# Patient Record
Sex: Female | Born: 1990 | Race: Asian | Hispanic: No | Marital: Married | Smoking: Never smoker
Health system: Southern US, Community
[De-identification: ages and names within clinical notes are randomized; demographics above are authoritative.]

## PROBLEM LIST (undated history)

## (undated) DIAGNOSIS — Z789 Other specified health status: Secondary | ICD-10-CM

## (undated) DIAGNOSIS — D649 Anemia, unspecified: Secondary | ICD-10-CM

## (undated) HISTORY — DX: Anemia, unspecified: D64.9

## (undated) HISTORY — PX: NO PAST SURGERIES: SHX2092

---

## 2011-11-18 ENCOUNTER — Emergency Department (INDEPENDENT_AMBULATORY_CARE_PROVIDER_SITE_OTHER)
Admission: EM | Admit: 2011-11-18 | Discharge: 2011-11-18 | Disposition: A | Discharge status: Home / Self Care | Payer: Self-pay | Source: Home / Self Care | Attending: Emergency Medicine | Admitting: Emergency Medicine

## 2011-11-18 ENCOUNTER — Encounter (HOSPITAL_COMMUNITY): Payer: Self-pay

## 2011-11-18 DIAGNOSIS — Z331 Pregnant state, incidental: Secondary | ICD-10-CM

## 2011-11-18 DIAGNOSIS — Z3490 Encounter for supervision of normal pregnancy, unspecified, unspecified trimester: Secondary | ICD-10-CM

## 2011-11-18 LAB — POCT PREGNANCY, URINE: Preg Test, Ur: POSITIVE — AB

## 2011-11-18 MED ORDER — PRENATAL VIT W/ FE BISG-FA 25-1 MG PO TABS: 1.0000 | ORAL_TABLET | Freq: Every morning | ORAL | Status: DC

## 2011-11-18 NOTE — ED Provider Notes (Signed)
History     CSN: [id number]  Arrival date & time 11/18/11  1118   First MD Initiated Contact with Patient 11/18/11 1223      Chief Complaint  Patient presents with  . Possible Pregnancy    (Consider location/radiation/quality/duration/timing/severity/associated sxs/prior treatment) Patient is a 21 y.o. female presenting with vomiting. The history is provided by the patient. No language interpreter was used.  Emesis  This is a new problem.  Pt is nauseated.  Pt reports she thinks she may be pregnant.  History reviewed. No pertinent past medical history.  History reviewed. No pertinent past surgical history.  No family history on file.  History  Substance Use Topics  . Smoking status: Never Smoker   . Smokeless tobacco: Not on file  . Alcohol Use: No    OB History    Grav Para Term Preterm Abortions TAB SAB Ect Mult Living                  Review of Systems  Gastrointestinal: Positive for vomiting.  All other systems reviewed and are negative.    Allergies  Review of patient's allergies indicates no known allergies.  Home Medications  No current outpatient prescriptions on file.  BP 109/69  Pulse 78  Temp 98.4 F (36.9 C) (Oral)  Resp 18  SpO2 100%  LMP 08/27/2011  Physical Exam  Nursing note and vitals reviewed. Constitutional: She is oriented to person, place, and time. She appears well-developed and well-nourished.  HENT:  Head: Normocephalic and atraumatic.  Eyes: Conjunctivae and EOM are normal. Pupils are equal, round, and reactive to light.  Neck: Normal range of motion.  Cardiovascular: Normal rate and normal heart sounds.   Pulmonary/Chest: Effort normal and breath sounds normal.  Abdominal: Soft.  Musculoskeletal: Normal range of motion.  Neurological: She is alert and oriented to person, place, and time.  Skin: Skin is warm.  Psychiatric: She has a normal mood and affect.    ED Course  Procedures (including critical care  time)  Labs Reviewed  POCT PREGNANCY, URINE - Abnormal; Notable for the following:    Preg Test, Ur POSITIVE (*)     All other components within normal limits   No results found.   1. Pregnancy       MDM  Prenatal vit        Lonia Skinner Reedy, [place] 11/18/11 1322

## 2011-11-18 NOTE — ED Notes (Signed)
Pt request pregnancy test.  LMP was 08/27/11.  Positive home pregnancy test.  Denies abdominal pain.  States having n/v.  Has one child that is 88 months old.

## 2011-11-18 NOTE — ED Notes (Signed)
Vitals obtained by CMA Gerilyn Nestle

## 2011-11-18 NOTE — ED Provider Notes (Signed)
Medical screening examination/treatment/procedure(s) were performed by non-physician practitioner and as supervising physician I was immediately available for consultation/collaboration.  Raynald Blend, MD 11/18/11 [phone]

## 2012-01-28 ENCOUNTER — Ambulatory Visit (INDEPENDENT_AMBULATORY_CARE_PROVIDER_SITE_OTHER): Payer: Medicaid Other | Admitting: Obstetrics and Gynecology

## 2012-01-28 DIAGNOSIS — Z3689 Encounter for other specified antenatal screening: Secondary | ICD-10-CM

## 2012-01-28 DIAGNOSIS — Z331 Pregnant state, incidental: Secondary | ICD-10-CM

## 2012-01-28 LAB — POCT URINALYSIS DIPSTICK
Bilirubin, UA: NEGATIVE
Leukocytes, UA: NEGATIVE
Nitrite, UA: NEGATIVE
pH, UA: 8

## 2012-01-28 NOTE — Progress Notes (Signed)
NOB INTERVIEW.  Pt's husband served as Nurse, learning disability. States does want QUAD screen.  Is aware if not covered by insurance, is responsible.  Anatomy U/S scheduled per SL.

## 2012-01-29 ENCOUNTER — Encounter: Payer: Self-pay | Admitting: Obstetrics and Gynecology

## 2012-01-29 ENCOUNTER — Ambulatory Visit (INDEPENDENT_AMBULATORY_CARE_PROVIDER_SITE_OTHER): Payer: Medicaid Other | Admitting: Obstetrics and Gynecology

## 2012-01-29 ENCOUNTER — Ambulatory Visit (INDEPENDENT_AMBULATORY_CARE_PROVIDER_SITE_OTHER): Payer: Medicaid Other

## 2012-01-29 VITALS — BP 100/58 | Wt 121.0 lb

## 2012-01-29 DIAGNOSIS — Z331 Pregnant state, incidental: Secondary | ICD-10-CM

## 2012-01-29 DIAGNOSIS — Z124 Encounter for screening for malignant neoplasm of cervix: Secondary | ICD-10-CM

## 2012-01-29 DIAGNOSIS — Z3689 Encounter for other specified antenatal screening: Secondary | ICD-10-CM

## 2012-01-29 DIAGNOSIS — N898 Other specified noninflammatory disorders of vagina: Secondary | ICD-10-CM

## 2012-01-29 LAB — AFP, QUAD SCREEN
AFP: 145 IU/mL
Age Alone: 1:1180 {titer}
HCG, Total: 18207 m[IU]/mL
Interpretation-AFP: NEGATIVE
MoM for AFP: 2.02
MoM for hCG: 0.97
Open Spina bifida: NEGATIVE
Tri 18 Scr Risk Est: NEGATIVE
uE3 Mom: 0.64
uE3 Value: 1.5 ng/mL

## 2012-01-29 LAB — PRENATAL PANEL VII
Basophils Absolute: 0 10*3/uL (ref 0.0–0.1)
Basophils Relative: 0 % (ref 0–1)
HIV: NONREACTIVE
Hemoglobin: 10.9 g/dL — ABNORMAL LOW (ref 12.0–15.0)
Hepatitis B Surface Ag: NEGATIVE
Lymphocytes Relative: 14 % (ref 12–46)
MCHC: 33.9 g/dL (ref 30.0–36.0)
Monocytes Relative: 8 % (ref 3–12)
Neutro Abs: 6.9 10*3/uL (ref 1.7–7.7)
Neutrophils Relative %: 77 % (ref 43–77)
WBC: 9 10*3/uL (ref 4.0–10.5)

## 2012-01-29 LAB — POCT WET PREP (WET MOUNT)
Bacteria Wet Prep HPF POC: NEGATIVE
Clue Cells Wet Prep Whiff POC: NEGATIVE
Trichomonas Wet Prep HPF POC: NEGATIVE
WBC, Wet Prep HPF POC: NEGATIVE
pH: 4.5

## 2012-01-29 NOTE — Progress Notes (Signed)
[redacted]w[redacted]d  U/S: Breech presentation, posterior placenta (placenta edge is 4.8 CM From internal OS- Normal), fluid is normal (Vertical Pocket = 5.2 CM).  No Anomalies Seen  (Profile, Palate, Philtrum , Nasal Bone, Open Hands, 5TH Digit, Heel, Feet seen. Female gender.  Normal Ovaries, No fluid in CDS, Normal [place].   No record of pap, pt does not speak english. Spouse is present to translate.

## 2012-01-29 NOTE — Patient Instructions (Signed)
ABCs of Pregnancy  A  Antepartum care is very important. Be sure you see your doctor and get prenatal care as soon as you think you are pregnant. At this time, you will be tested for infection, genetic abnormalities and potential problems with you and the pregnancy. This is the time to discuss diet, exercise, work, medications, labor, pain medication during labor and the possibility of a cesarean delivery. Ask any questions that may concern you. It is important to see your doctor regularly throughout your pregnancy. Avoid exposure to toxic substances and chemicals - such as cleaning solvents, lead and mercury, some insecticides, and paint. Pregnant women should avoid exposure to paint fumes, and fumes that cause you to feel ill, dizzy or faint. When possible, it is a good idea to have a pre-pregnancy consultation with your caregiver to begin some important recommendations your caregiver suggests such as, taking folic acid, exercising, quitting smoking, avoiding alcoholic beverages, etc.  B  Breastfeeding is the healthiest choice for both you and your baby. It has many nutritional benefits for the baby and health benefits for the mother. It also creates a very tight and loving bond between the baby and mother. Talk to your doctor, your family and friends, and your employer about how you choose to feed your baby and how they can support you in your decision. Not all birth defects can be prevented, but a woman can take actions that may increase her chance of having a healthy baby. Many birth defects happen very early in pregnancy, sometimes before a woman even knows she is pregnant. Birth defects or abnormalities of any child in your or the father's family should be discussed with your caregiver. Get a good support bra as your breast size changes. Wear it especially when you exercise and when nursing.   C  Celebrate the news of your pregnancy with the your spouse/father and family. Childbirth classes are helpful to  take for you and the spouse/father because it helps to understand what happens during the pregnancy, labor and delivery. Cesarean delivery should be discussed with your doctor so you are prepared for that possibility. The pros and cons of circumcision if it is a boy, should be discussed with your pediatrician. Cigarette smoking during pregnancy can result in low birth weight babies. It has been associated with infertility, miscarriages, tubal pregnancies, infant death (mortality) and poor health (morbidity) in childhood. Additionally, cigarette smoking may cause long-term learning disabilities. If you smoke, you should try to quit before getting pregnant and not smoke during the pregnancy. Secondary smoke may also harm a mother and her developing baby. It is a good idea to ask people to stop smoking around you during your pregnancy and after the baby is born. Extra calcium is necessary when you are pregnant and is found in your prenatal vitamin, in dairy products, green leafy vegetables and in calcium supplements.  D  A healthy diet according to your current weight and height, along with vitamins and mineral supplements should be discussed with your caregiver. Domestic abuse or violence should be made known to your doctor right away to get the situation corrected. Drink more water when you exercise to keep hydrated. Discomfort of your back and legs usually develops and progresses from the middle of the second trimester through to delivery of the baby. This is because of the enlarging baby and uterus, which may also affect your balance. Do not take illegal drugs. Illegal drugs can seriously harm the baby and you. Drink extra   fluids (water is best) throughout pregnancy to help your body keep up with the increases in your blood volume. Drink at least 6 to 8 glasses of water, fruit juice, or milk each day. A good way to know you are drinking enough fluid is when your urine looks almost like clear water or is very light  yellow.   E  Eat healthy to get the nutrients you and your unborn baby need. Your meals should include the five basic food groups. Exercise (30 minutes of light to moderate exercise a day) is important and encouraged during pregnancy, if there are no medical problems or problems with the pregnancy. Exercise that causes discomfort or dizziness should be stopped and reported to your caregiver. Emotions during pregnancy can change from being ecstatic to depression and should be understood by you, your partner and your family.  F  Fetal screening with ultrasound, amniocentesis and monitoring during pregnancy and labor is common and sometimes necessary. Take 400 micrograms of folic acid daily both before, when possible, and during the first few months of pregnancy to reduce the risk of birth defects of the brain and spine. All women who could possibly become pregnant should take a vitamin with folic acid, every day. It is also important to eat a healthy diet with fortified foods (enriched grain products, including cereals, rice, breads, and pastas) and foods with natural sources of folate (orange juice, green leafy vegetables, beans, peanuts, broccoli, asparagus, peas, and lentils). The father should be involved with all aspects of the pregnancy including, the prenatal care, childbirth classes, labor, delivery, and postpartum time. Fathers may also have emotional concerns about being a father, financial needs, and raising a family.  G  Genetic testing should be done appropriately. It is important to know your family and the father's history. If there have been problems with pregnancies or birth defects in your family, report these to your doctor. Also, genetic counselors can talk with you about the information you might need in making decisions about having a family. You can call a major medical center in your area for help in finding a board-certified genetic counselor. Genetic testing and counseling should be done  before pregnancy when possible, especially if there is a history of problems in the mother's or father's family. Certain ethnic backgrounds are more at risk for genetic defects.  H  Get familiar with the hospital where you will be having your baby. Get to know how long it takes to get there, the labor and delivery area, and the hospital procedures. Be sure your medical insurance is accepted there. Get your home ready for the baby including, clothes, the baby's room (when possible), furniture and car seat. Hand washing is important throughout the day, especially after handling raw meat and poultry, changing the baby's diaper or using the bathroom. This can help prevent the spread of many bacteria and viruses that cause infection. Your hair may become dry and thinner, but will return to normal a few weeks after the baby is born. Heartburn is a common problem that can be treated by taking antacids recommended by your caregiver, eating smaller meals 5 or 6 times a day, not drinking liquids when eating, drinking between meals and raising the head of your bed 2 to 3 inches.  I  Insurance to cover you, the baby, doctor and hospital should be reviewed so that you will be prepared to pay any costs not covered by your insurance plan. If you do not have medical insurance,   there are usually clinics and services available for you in your community. Take 30 milligrams of iron during your pregnancy as prescribed by your doctor to reduce the risk of low red blood cells (anemia) later in pregnancy. All women of childbearing age should eat a diet rich in iron.  J  There should be a joint effort for the mother, father and any other children to adapt to the pregnancy financially, emotionally, and psychologically during the pregnancy. Join a support group for moms-to-be. Or, join a class on parenting or childbirth. Have the family participate when possible.  K  Know your limits. Let your caregiver know if you experience any of the  following:   · Pain of any kind.  · Strong cramps.  · You develop a lot of weight in a short period of time (5 pounds in 3 to 5 days).  · Vaginal bleeding, leaking of amniotic fluid.  · Headache, vision problems.  · Dizziness, fainting, shortness of breath.  · Chest pain.  · Fever of 102° F (38.9° C) or higher.  · Gush of clear fluid from your vagina.  · Painful urination.  · Domestic violence.  · Irregular heartbeat (palpitations).  · Rapid beating of the heart (tachycardia).  · Constant feeling sick to your stomach (nauseous) and vomiting.  · Trouble walking, fluid retention (edema).  · Muscle weakness.  · If your baby has decreased activity.  · Persistent diarrhea.  · Abnormal vaginal discharge.  · Uterine contractions at 20-minute intervals.  · Back pain that travels down your leg.  L  Learn and practice that what you eat and drink should be in moderation and healthy for you and your baby. Legal drugs such as alcohol and caffeine are important issues for pregnant women. There is no safe amount of alcohol a woman can drink while pregnant. Fetal alcohol syndrome, a disorder characterized by growth retardation, facial abnormalities, and central nervous system dysfunction, is caused by a woman's use of alcohol during pregnancy. Caffeine, found in tea, coffee, soft drinks and chocolate, should also be limited. Be sure to read labels when trying to cut down on caffeine during pregnancy. More than 200 foods, beverages, and over-the-counter medications contain caffeine and have a high salt content! There are coffees and teas that do not contain caffeine.  M  Medical conditions such as diabetes, epilepsy, and high blood pressure should be treated and kept under control before pregnancy when possible, but especially during pregnancy. Ask your caregiver about any medications that may need to be changed or adjusted during pregnancy. If you are currently taking any medications, ask your caregiver if it is safe to take them  while you are pregnant or before getting pregnant when possible. Also, be sure to discuss any herbs or vitamins you are taking. They are medicines, too! Discuss with your doctor all medications, prescribed and over-the-counter, that you are taking. During your prenatal visit, discuss the medications your doctor may give you during labor and delivery.  N  Never be afraid to ask your doctor or caregiver questions about your health, the progress of the pregnancy, family problems, stressful situations, and recommendation for a pediatrician, if you do not have one. It is better to take all precautions and discuss any questions or concerns you may have during your office visits. It is a good idea to write down your questions before you visit the doctor.  O  Over-the-counter cough and cold remedies may contain alcohol or other ingredients that should   be avoided during pregnancy. Ask your caregiver about prescription, herbs or over-the-counter medications that you are taking or may consider taking while pregnant.   P  Physical activity during pregnancy can benefit both you and your baby by lessening discomfort and fatigue, providing a sense of well-being, and increasing the likelihood of early recovery after delivery. Light to moderate exercise during pregnancy strengthens the belly (abdominal) and back muscles. This helps improve posture. Practicing yoga, walking, swimming, and cycling on a stationary bicycle are usually safe exercises for pregnant women. Avoid scuba diving, exercise at high altitudes (over 3000 feet), skiing, horseback riding, contact sports, etc. Always check with your doctor before beginning any kind of exercise, especially during pregnancy and especially if you did not exercise before getting pregnant.  Q  Queasiness, stomach upset and morning sickness are common during pregnancy. Eating a couple of crackers or dry toast before getting out of bed. Foods that you normally love may make you feel sick to  your stomach. You may need to substitute other nutritious foods. Eating 5 or 6 small meals a day instead of 3 large ones may make you feel better. Do not drink with your meals, drink between meals. Questions that you have should be written down and asked during your prenatal visits.  R  Read about and make plans to baby-proof your home. There are important tips for making your home a safer environment for your baby. Review the tips and make your home safer for you and your baby. Read food labels regarding calories, salt and fat content in the food.  S  Saunas, hot tubs, and steam rooms should be avoided while you are pregnant. Excessive high heat may be harmful during your pregnancy. Your caregiver will screen and examine you for sexually transmitted diseases and genetic disorders during your prenatal visits. Learn the signs of labor. Sexual relations while pregnant is safe unless there is a medical or pregnancy problem and your caregiver advises against it.  T  Traveling long distances should be avoided especially in the third trimester of your pregnancy. If you do have to travel out of state, be sure to take a copy of your medical records and medical insurance plan with you. You should not travel long distances without seeing your doctor first. Most airlines will not allow you to travel after 36 weeks of pregnancy. Toxoplasmosis is an infection caused by a parasite that can seriously harm an unborn baby. Avoid eating undercooked meat and handling cat litter. Be sure to wear gloves when gardening. Tingling of the hands and fingers is not unusual and is due to fluid retention. This will go away after the baby is born.  U  Womb (uterus) size increases during the first trimester. Your kidneys will begin to function more efficiently. This may cause you to feel the need to urinate more often. You may also leak urine when sneezing, coughing or laughing. This is due to the growing uterus pressing against your bladder,  which lies directly in front of and slightly under the uterus during the first few months of pregnancy. If you experience burning along with frequency of urination or bloody urine, be sure to tell your doctor. The size of your uterus in the third trimester may cause a problem with your balance. It is advisable to maintain good posture and avoid wearing high heels during this time. An ultrasound of your baby may be necessary during your pregnancy and is safe for you and your baby.  V    Vaccinations are an important concern for pregnant women. Get needed vaccines before pregnancy. Center for Disease Control (www.cdc.gov) has clear guidelines for the use of vaccines during pregnancy. Review the list, be sure to discuss it with your doctor. Prenatal vitamins are helpful and healthy for you and the baby. Do not take extra vitamins except what is recommended. Taking too much of certain vitamins can cause overdose problems. Continuous vomiting should be reported to your caregiver. Varicose veins may appear especially if there is a family history of varicose veins. They should subside after the delivery of the baby. Support hose helps if there is leg discomfort.  W  Being overweight or underweight during pregnancy may cause problems. Try to get within 15 pounds of your ideal weight before pregnancy. Remember, pregnancy is not a time to be dieting! Do not stop eating or start skipping meals as your weight increases. Both you and your baby need the calories and nutrition you receive from a healthy diet. Be sure to consult with your doctor about your diet. There is a formula and diet plan available depending on whether you are overweight or underweight. Your caregiver or nutritionist can help and advise you if necessary.  X  Avoid X-rays. If you must have dental work or diagnostic tests, tell your dentist or physician that you are pregnant so that extra care can be taken. X-rays should only be taken when the risks of not taking  them outweigh the risk of taking them. If needed, only the minimum amount of radiation should be used. When X-rays are necessary, protective lead shields should be used to cover areas of the body that are not being X-rayed.  Y  Your baby loves you. Breastfeeding your baby creates a loving and very close bond between the two of you. Give your baby a healthy environment to live in while you are pregnant. Infants and children require constant care and guidance. Their health and safety should be carefully watched at all times. After the baby is born, rest or take a nap when the baby is sleeping.  Z  Get your ZZZs. Be sure to get plenty of rest. Resting on your side as often as possible, especially on your left side is advised. It provides the best circulation to your baby and helps reduce swelling. Try taking a nap for 30 to 45 minutes in the afternoon when possible. After the baby is born rest or take a nap when the baby is sleeping. Try elevating your feet for that amount of time when possible. It helps the circulation in your legs and helps reduce swelling.   Most information courtesy of the CDC.  Document Released: 03/11/2005 Document Revised: 06/03/2011 Document Reviewed: 11/23/2008  [employer]® Patient Information ©2013 [employer].

## 2012-01-29 NOTE — Progress Notes (Addendum)
[facility] NEW OB EXAMINATION   Courtney Clark is a 21 y.o. female, G2P1001, who presents at [redacted]w[redacted]d gestation for a new obstetrical examination.  The following portions of the patient's history were reviewed and updated as appropriate: allergies, current medications, past family history, past medical history, past social history, past surgical history and problem list.  OB History    Grav Para Term Preterm Abortions TAB SAB Ect Mult Living   2 1 1       1       Past Medical History  Diagnosis Date  . Anemia     PP    Past Surgical History  Procedure Date  . No past surgeries     Family History  Problem Relation Age of Onset  . Heart disease Mother     Social History:  reports that she has never smoked. She has never used smokeless tobacco. She reports that she does not drink alcohol or use illicit drugs.  Allergies: No Known Allergies  Medications: none yet but we will start prenatal vitamins   Objective:    BP 100/58  Wt 121 lb (54.885 kg)  LMP 08/27/2011    Weight:  Wt Readings from Last 1 Encounters:  01/29/12 121 lb (54.885 kg) (35.75%*)   * Growth percentiles are based on CDC 2-20 Years data.          BMI: There is no height on file to calculate BMI.  General Appearance: Alert, appropriate appearance for age. No acute distress HEENT: Grossly normal Neck / Thyroid: Supple, no masses, nodes or enlargement Lungs: clear to auscultation bilaterally Back: No CVA tenderness Breast Exam: No masses or nodes.No dimpling, nipple retraction or discharge. Cardiovascular: Regular rate and rhythm. S1, S2, no murmur Gastrointestinal: Soft, non-tender, no masses or organomegaly.                               Fundal height: 22 weeks                               Fetal heart tones audible: yes  ++++++++++++++++++++++++++++++++++++++++++++++++++++++++  Pelvic Exam: External genitalia: normal general appearance Vaginal: normal without tenderness, induration or masses and  relaxation: No Cervix: normal appearance Adnexa: normal bimanual exam Uterus: gravid, nontender, 22 weeks size  ++++++++++++++++++++++++++++++++++++++++++++++++++++++++  Lymphatic Exam: Non-palpable nodes in neck, clavicular, axillary, or inguinal regions Neurologic: Normal speech, no tremor  Psychiatric: Alert and oriented, appropriate affect.  Prenatal labs: ABO, Rh: O/POS/-- (11/05 1202) Antibody: NEG (11/05 1202) Rubella:  immune RPR: NON REAC (11/05 1202)  HBsAg: NEGATIVE (11/05 1202)  HIV: NON REACTIVE (11/05 1202)  GBS:   third trimester  Wet Prep:   Previously done:            no                     If no: Whiff:                     Negative                              Clue cells:             no  PH:                        4.5                              Yeast:                    no                              Trichomoniasis:    no  Urine analysis:     Negative    Assessment:   21 y.o. female G2P1001 at [redacted]w[redacted]d gestation ( EDC is June 02, 2012) by: Normal Last menstrual period: yes Ultrasound:  Single gestation, breech presentation, normal fluid, normal anatomy, female, cervix 3.79 cm, 15 ounces (16%) but estimated gestational age is 22 weeks and 0 days based on today's ultrasound.    Late prenatal care.                             Patient has limited English skills   Plan:    GC and Chlamydia sent.  Glucola next visit.  Repeat ultrasound if size is less than date.  Urine culture sent.  We discussed routine pregnancy issues:  The patient was told to avoid predator fish including tuna because of our concerns for mercury consumption.  The patient was told to avoid soft cheeses.  The patient was told to be sure that all lunch meats are well cooked.  Genetic screening was discussed. no  Our model for pregnancy management was reviewed.  Proper diet and exercise reviewed.  Return to office in 4 weeks.  Medications  include:  Prenatal vitamins  ABC's of pregnancy given.  Mylinda Latina.D.

## 2012-01-30 LAB — PAP IG, CT-NG, RFX HPV ASCU

## 2012-01-30 LAB — CULTURE, OB URINE: Colony Count: 30000

## 2012-01-30 LAB — US OB COMP + 14 WK

## 2012-01-30 LAB — VARICELLA ZOSTER ANTIBODY, IGM: Varicella Zoster Ab IgM: 0.22 {ISR} (ref <0.91)

## 2012-01-31 LAB — HEMOGLOBINOPATHY EVALUATION
Hemoglobin Other: 17.3 % — ABNORMAL HIGH
Hgb A2 Quant: 4.1 % — ABNORMAL HIGH (ref 2.2–3.2)
Hgb F Quant: 0 % (ref 0.0–2.0)

## 2012-02-01 ENCOUNTER — Encounter: Payer: Self-pay | Admitting: Obstetrics and Gynecology

## 2012-02-01 DIAGNOSIS — D582 Other hemoglobinopathies: Secondary | ICD-10-CM | POA: Insufficient documentation

## 2012-02-01 DIAGNOSIS — D649 Anemia, unspecified: Secondary | ICD-10-CM | POA: Insufficient documentation

## 2012-02-06 ENCOUNTER — Encounter: Payer: Self-pay | Admitting: Obstetrics and Gynecology

## 2012-02-06 DIAGNOSIS — O0930 Supervision of pregnancy with insufficient antenatal care, unspecified trimester: Secondary | ICD-10-CM | POA: Insufficient documentation

## 2012-02-06 LAB — HGB ELECTROPHORESIS REFLEXED REPORT
Hemoglobin A2 - HGBRFX: 4.3 % — ABNORMAL HIGH (ref 1.8–3.5)
Hemoglobin E: 18.1 % — ABNORMAL HIGH
Hemoglobin F - HGBRFX: 0.2 % (ref <2.0)

## 2012-02-26 ENCOUNTER — Ambulatory Visit (INDEPENDENT_AMBULATORY_CARE_PROVIDER_SITE_OTHER): Payer: Medicaid Other | Admitting: Obstetrics and Gynecology

## 2012-02-26 ENCOUNTER — Encounter: Payer: Self-pay | Admitting: Obstetrics and Gynecology

## 2012-02-26 VITALS — BP 110/60 | Wt 127.0 lb

## 2012-02-26 DIAGNOSIS — Z331 Pregnant state, incidental: Secondary | ICD-10-CM

## 2012-02-26 NOTE — Progress Notes (Signed)
[redacted]w[redacted]d Pt with some itching on her abdomen No rash seen.  recommend benadryl cream or lubriderm

## 2012-02-26 NOTE — Patient Instructions (Signed)
Try benadryl cream or lubriderm for itching

## 2012-02-26 NOTE — Progress Notes (Signed)
Pt declines flu shot.  

## 2012-03-12 ENCOUNTER — Ambulatory Visit (INDEPENDENT_AMBULATORY_CARE_PROVIDER_SITE_OTHER): Payer: Medicaid Other | Admitting: Obstetrics and Gynecology

## 2012-03-12 VITALS — BP 110/70 | Ht 62.0 in | Wt 130.0 lb

## 2012-03-12 DIAGNOSIS — O0930 Supervision of pregnancy with insufficient antenatal care, unspecified trimester: Secondary | ICD-10-CM

## 2012-03-12 DIAGNOSIS — Z331 Pregnant state, incidental: Secondary | ICD-10-CM

## 2012-03-12 DIAGNOSIS — D649 Anemia, unspecified: Secondary | ICD-10-CM

## 2012-03-12 LAB — HEMOGLOBIN: Hemoglobin: 9 g/dL — ABNORMAL LOW (ref 12.0–15.0)

## 2012-03-12 NOTE — Progress Notes (Signed)
[redacted]w[redacted]d 1hr gtt today No complaints today. Declines flu shot

## 2012-03-17 ENCOUNTER — Other Ambulatory Visit: Payer: Self-pay | Admitting: Obstetrics and Gynecology

## 2012-03-17 ENCOUNTER — Telehealth: Payer: Self-pay | Admitting: Obstetrics and Gynecology

## 2012-03-17 DIAGNOSIS — O99810 Abnormal glucose complicating pregnancy: Secondary | ICD-10-CM

## 2012-03-17 NOTE — Telephone Encounter (Signed)
Notified pt of elevated 1 hr GTT.  Sch pt for 3 hr GTT on 03-31-2012.  Mailed pt diet and appt info.

## 2012-03-24 ENCOUNTER — Ambulatory Visit (INDEPENDENT_AMBULATORY_CARE_PROVIDER_SITE_OTHER): Payer: Medicaid Other | Admitting: Obstetrics and Gynecology

## 2012-03-24 VITALS — BP 98/58 | Wt 131.0 lb

## 2012-03-24 DIAGNOSIS — O0930 Supervision of pregnancy with insufficient antenatal care, unspecified trimester: Secondary | ICD-10-CM

## 2012-03-24 NOTE — Progress Notes (Signed)
[redacted]w[redacted]d  Pt voices no complaints Has 3 hr gtt scheduled 03/31/12 Acknowledges understanding of preparatory diet

## 2012-03-25 NOTE — L&D Delivery Note (Signed)
Delivery Note Pt's labor progressed well after admission.  Pt received one dose of IV Fentanyl at 0751 and cx 6-7 cm at that time.  At 0905 cx 9cm and AROM large amt of MSF.  Cervix complete at 0918 and pushing started.  At 9:33 AM a viable female "[name]" was delivered via Vaginal, Spontaneous Delivery (Presentation: Right Occiput Anterior).  APGAR: , TBA; weight 6 lb 15 oz (3147 g).   Placenta status: Intact, Spontaneous, MS, Schultz w/ trailing membranes.  Cord: 3 vessels with the following complications: None.  Cord pH: not collected.  Anesthesia: Other  Episiotomy: None Lacerations: 1st degree;Sulcus Suture Repair: 3 interrupted stitches; 3-0 & 4-0 monocryl Est. Blood Loss (mL):  Mom to postpartum.  Baby to nursery-stable. Declines circumcision and currently intends to formula-feed. Pt, FOB, pt's sister, and newborn all bonding well in recovery and stable.  Courtney Clark H 05/21/2012, 10:11 AM

## 2012-04-01 LAB — GLUCOSE TOLERANCE, 3 HOURS
Glucose Tolerance, 2 hour: 157 mg/dL (ref 70–164)
Glucose Tolerance, Fasting: 80 mg/dL (ref 70–104)
Glucose, GTT - 3 Hour: 141 mg/dL (ref 70–144)

## 2012-04-06 ENCOUNTER — Encounter: Payer: Medicaid Other | Admitting: Obstetrics and Gynecology

## 2012-04-09 ENCOUNTER — Ambulatory Visit: Payer: Medicaid Other | Admitting: Obstetrics and Gynecology

## 2012-04-09 ENCOUNTER — Encounter: Payer: Self-pay | Admitting: Obstetrics and Gynecology

## 2012-04-09 VITALS — BP 106/58 | Wt 129.0 lb

## 2012-04-09 DIAGNOSIS — Z331 Pregnant state, incidental: Secondary | ICD-10-CM

## 2012-04-09 MED ORDER — FERROUS SULFATE 300 (60 FE) MG/5ML PO SYRP: 300.0000 mg | ORAL_SOLUTION | Freq: Every day | ORAL | Status: DC

## 2012-04-09 MED ORDER — DOCUSATE SODIUM 250 MG PO CAPS: 250.0000 mg | ORAL_CAPSULE | Freq: Every day | ORAL | Status: DC

## 2012-04-09 NOTE — Progress Notes (Signed)
[redacted]w[redacted]d  GFM 3 hr GTT normal. Hgb 9.0: nu-iron prescribed

## 2012-04-09 NOTE — Progress Notes (Signed)
[redacted]w[redacted]d Pt has no complaints

## 2012-04-23 ENCOUNTER — Ambulatory Visit: Payer: Medicaid Other | Admitting: Obstetrics and Gynecology

## 2012-04-23 VITALS — BP 100/62 | Wt 135.0 lb

## 2012-04-23 DIAGNOSIS — D649 Anemia, unspecified: Secondary | ICD-10-CM

## 2012-04-23 DIAGNOSIS — Z331 Pregnant state, incidental: Secondary | ICD-10-CM

## 2012-04-23 DIAGNOSIS — O0930 Supervision of pregnancy with insufficient antenatal care, unspecified trimester: Secondary | ICD-10-CM

## 2012-04-23 MED ORDER — FERROUS SULFATE 300 (60 FE) MG/5ML PO SYRP: 300.0000 mg | ORAL_SOLUTION | Freq: Every day | ORAL | Status: DC

## 2012-04-23 NOTE — Progress Notes (Signed)
Pt stated no issues today. Pt did not get her iron due to pharmacy didn't have it . Will reorder rx again.  GC/CHL/GBS NV

## 2012-05-07 ENCOUNTER — Encounter: Payer: Medicaid Other | Admitting: Obstetrics and Gynecology

## 2012-05-07 ENCOUNTER — Other Ambulatory Visit: Payer: Medicaid Other

## 2012-05-13 ENCOUNTER — Other Ambulatory Visit: Payer: Medicaid Other

## 2012-05-13 ENCOUNTER — Encounter: Payer: Self-pay | Admitting: Family Medicine

## 2012-05-13 ENCOUNTER — Ambulatory Visit: Payer: Medicaid Other | Admitting: Family Medicine

## 2012-05-13 VITALS — BP 102/64 | Wt 139.0 lb

## 2012-05-13 NOTE — Progress Notes (Signed)
[redacted]w[redacted]d No complaints today.

## 2012-05-13 NOTE — Progress Notes (Signed)
[redacted]w[redacted]d Doing well. Would like cervical exam, has contractions off and on throughout the day, sometimes really painful. Education given on how to time contractions. Unsure of pediatrician. (List given) Having difficulty obtaining car seat. Encouraged to check with [facility] or DSS.  Patient considering Depo-Provera vs. Mirena for contraception (methods handout given).  Discussed circumcision and patient declined (handout given).    GBS collected.  1 week ROB need GC/CT. L.Jannice Beitzel, FNP-BC

## 2012-05-15 LAB — STREP B DNA PROBE: GBSP: NEGATIVE

## 2012-05-21 ENCOUNTER — Encounter (HOSPITAL_COMMUNITY): Payer: Self-pay | Admitting: *Deleted

## 2012-05-21 ENCOUNTER — Inpatient Hospital Stay (HOSPITAL_COMMUNITY)
Admission: AD | Admit: 2012-05-21 | Discharge: 2012-05-22 | DRG: 775 | Disposition: A | Discharge status: Home / Self Care | Payer: Medicaid Other | Source: Ambulatory Visit | Attending: Obstetrics and Gynecology | Admitting: Obstetrics and Gynecology

## 2012-05-21 ENCOUNTER — Encounter: Payer: Medicaid Other | Admitting: Obstetrics and Gynecology

## 2012-05-21 DIAGNOSIS — O9902 Anemia complicating childbirth: Secondary | ICD-10-CM | POA: Diagnosis present

## 2012-05-21 DIAGNOSIS — Z603 Acculturation difficulty: Secondary | ICD-10-CM

## 2012-05-21 DIAGNOSIS — Z789 Other specified health status: Secondary | ICD-10-CM

## 2012-05-21 DIAGNOSIS — D649 Anemia, unspecified: Secondary | ICD-10-CM | POA: Diagnosis present

## 2012-05-21 HISTORY — DX: Other specified health status: Z78.9

## 2012-05-21 LAB — RPR: RPR Ser Ql: NONREACTIVE

## 2012-05-21 LAB — CBC
HCT: 32.5 % — ABNORMAL LOW (ref 36.0–46.0)
Hemoglobin: 10.1 g/dL — ABNORMAL LOW (ref 12.0–15.0)
MCH: 18.6 pg — ABNORMAL LOW (ref 26.0–34.0)
MCHC: 31.1 g/dL (ref 30.0–36.0)
RBC: 5.44 MIL/uL — ABNORMAL HIGH (ref 3.87–5.11)

## 2012-05-21 LAB — ABO/RH: ABO/RH(D): O POS

## 2012-05-21 LAB — TYPE AND SCREEN
ABO/RH(D): O POS
Antibody Screen: NEGATIVE

## 2012-05-21 MED ORDER — MEASLES, MUMPS & RUBELLA VAC ~~LOC~~ INJ
0.5000 mL | INJECTION | Freq: Once | SUBCUTANEOUS | Status: DC
  Filled 2012-05-21: qty 0.5

## 2012-05-21 MED ORDER — BENZOCAINE-MENTHOL 20-0.5 % EX AERO: 1.0000 "application " | INHALATION_SPRAY | CUTANEOUS | Status: DC | PRN

## 2012-05-21 MED ORDER — MAGNESIUM HYDROXIDE 400 MG/5ML PO SUSP: 30.0000 mL | ORAL | Status: DC | PRN

## 2012-05-21 MED ORDER — CITRIC ACID-SODIUM CITRATE 334-500 MG/5ML PO SOLN: 30.0000 mL | ORAL | Status: DC | PRN

## 2012-05-21 MED ORDER — LANOLIN HYDROUS EX OINT: TOPICAL_OINTMENT | CUTANEOUS | Status: DC | PRN

## 2012-05-21 MED ORDER — OXYCODONE-ACETAMINOPHEN 5-325 MG PO TABS
1.0000 | ORAL_TABLET | ORAL | Status: DC | PRN
  Administered 2012-05-21: 1 via ORAL
  Filled 2012-05-21: qty 1

## 2012-05-21 MED ORDER — ONDANSETRON HCL 4 MG PO TABS: 4.0000 mg | ORAL_TABLET | ORAL | Status: DC | PRN

## 2012-05-21 MED ORDER — INFLUENZA VIRUS VACC SPLIT PF IM SUSP
0.5000 mL | INTRAMUSCULAR | Status: AC
  Administered 2012-05-22: 0.5 mL via INTRAMUSCULAR
  Filled 2012-05-21: qty 0.5

## 2012-05-21 MED ORDER — WITCH HAZEL-GLYCERIN EX PADS: 1.0000 "application " | MEDICATED_PAD | CUTANEOUS | Status: DC | PRN

## 2012-05-21 MED ORDER — OXYTOCIN 40 UNITS IN LACTATED RINGERS INFUSION - SIMPLE MED
62.5000 mL/h | INTRAVENOUS | Status: DC
  Administered 2012-05-21: 500 mL/h via INTRAVENOUS
  Filled 2012-05-21: qty 1000

## 2012-05-21 MED ORDER — IBUPROFEN 600 MG PO TABS
600.0000 mg | ORAL_TABLET | Freq: Four times a day (QID) | ORAL | Status: DC | PRN
  Administered 2012-05-21: 600 mg via ORAL
  Filled 2012-05-21: qty 1

## 2012-05-21 MED ORDER — SIMETHICONE 80 MG PO CHEW: 80.0000 mg | CHEWABLE_TABLET | ORAL | Status: DC | PRN

## 2012-05-21 MED ORDER — PRENATAL MULTIVITAMIN CH
1.0000 | ORAL_TABLET | Freq: Every day | ORAL | Status: DC
  Administered 2012-05-21 – 2012-05-22 (×2): 1 via ORAL
  Filled 2012-05-21 (×2): qty 1

## 2012-05-21 MED ORDER — OXYTOCIN BOLUS FROM INFUSION: 500.0000 mL | INTRAVENOUS | Status: DC

## 2012-05-21 MED ORDER — LIDOCAINE HCL (PF) 1 % IJ SOLN
30.0000 mL | INTRAMUSCULAR | Status: DC | PRN
  Administered 2012-05-21: 30 mL via SUBCUTANEOUS
  Filled 2012-05-21 (×2): qty 30

## 2012-05-21 MED ORDER — ONDANSETRON HCL 4 MG/2ML IJ SOLN: 4.0000 mg | INTRAMUSCULAR | Status: DC | PRN

## 2012-05-21 MED ORDER — OXYCODONE-ACETAMINOPHEN 5-325 MG PO TABS
1.0000 | ORAL_TABLET | ORAL | Status: DC | PRN
  Administered 2012-05-21 (×2): 1 via ORAL
  Filled 2012-05-21 (×2): qty 1

## 2012-05-21 MED ORDER — POLYSACCHARIDE IRON COMPLEX 150 MG PO CAPS
150.0000 mg | ORAL_CAPSULE | Freq: Two times a day (BID) | ORAL | Status: DC
  Administered 2012-05-21 – 2012-05-22 (×3): 150 mg via ORAL
  Filled 2012-05-21 (×5): qty 1

## 2012-05-21 MED ORDER — LACTATED RINGERS IV SOLN
INTRAVENOUS | Status: DC
  Administered 2012-05-21: 125 mL/h via INTRAVENOUS

## 2012-05-21 MED ORDER — ZOLPIDEM TARTRATE 5 MG PO TABS: 5.0000 mg | ORAL_TABLET | Freq: Every evening | ORAL | Status: DC | PRN

## 2012-05-21 MED ORDER — ACETAMINOPHEN 325 MG PO TABS: 650.0000 mg | ORAL_TABLET | ORAL | Status: DC | PRN

## 2012-05-21 MED ORDER — METHYLERGONOVINE MALEATE 0.2 MG PO TABS: 0.2000 mg | ORAL_TABLET | ORAL | Status: DC | PRN

## 2012-05-21 MED ORDER — LACTATED RINGERS IV SOLN: 500.0000 mL | INTRAVENOUS | Status: DC | PRN

## 2012-05-21 MED ORDER — MEDROXYPROGESTERONE ACETATE 150 MG/ML IM SUSP: 150.0000 mg | INTRAMUSCULAR | Status: DC | PRN

## 2012-05-21 MED ORDER — DIBUCAINE 1 % RE OINT: 1.0000 "application " | TOPICAL_OINTMENT | RECTAL | Status: DC | PRN

## 2012-05-21 MED ORDER — METHYLERGONOVINE MALEATE 0.2 MG/ML IJ SOLN: 0.2000 mg | INTRAMUSCULAR | Status: DC | PRN

## 2012-05-21 MED ORDER — ONDANSETRON HCL 4 MG/2ML IJ SOLN: 4.0000 mg | Freq: Four times a day (QID) | INTRAMUSCULAR | Status: DC | PRN

## 2012-05-21 MED ORDER — DIPHENHYDRAMINE HCL 25 MG PO CAPS: 25.0000 mg | ORAL_CAPSULE | Freq: Four times a day (QID) | ORAL | Status: DC | PRN

## 2012-05-21 MED ORDER — FENTANYL CITRATE 0.05 MG/ML IJ SOLN
100.0000 ug | INTRAMUSCULAR | Status: DC | PRN
  Administered 2012-05-21: 100 ug via INTRAVENOUS
  Filled 2012-05-21: qty 2

## 2012-05-21 MED ORDER — TETANUS-DIPHTH-ACELL PERTUSSIS 5-2.5-18.5 LF-MCG/0.5 IM SUSP
0.5000 mL | Freq: Once | INTRAMUSCULAR | Status: AC
  Administered 2012-05-21: 0.5 mL via INTRAMUSCULAR
  Filled 2012-05-21: qty 0.5

## 2012-05-21 MED ORDER — IBUPROFEN 600 MG PO TABS
600.0000 mg | ORAL_TABLET | Freq: Four times a day (QID) | ORAL | Status: DC
  Administered 2012-05-21 – 2012-05-22 (×4): 600 mg via ORAL
  Filled 2012-05-21 (×4): qty 1

## 2012-05-21 MED ORDER — SENNOSIDES-DOCUSATE SODIUM 8.6-50 MG PO TABS
2.0000 | ORAL_TABLET | Freq: Every day | ORAL | Status: DC
  Administered 2012-05-21: 2 via ORAL

## 2012-05-21 NOTE — MAU Note (Signed)
Abdominal pain for 2-3 minutes and then it will go away and then come back. Had some yellow liquid coming out yesterday.

## 2012-05-22 LAB — CBC
HCT: 29.1 % — ABNORMAL LOW (ref 36.0–46.0)
Hemoglobin: 9.2 g/dL — ABNORMAL LOW (ref 12.0–15.0)
MCV: 59.4 fL — ABNORMAL LOW (ref 78.0–100.0)
RBC: 4.9 MIL/uL (ref 3.87–5.11)
RDW: 21.4 % — ABNORMAL HIGH (ref 11.5–15.5)
WBC: 15.8 10*3/uL — ABNORMAL HIGH (ref 4.0–10.5)

## 2012-05-22 MED ORDER — IBUPROFEN 600 MG PO TABS: 600.0000 mg | ORAL_TABLET | Freq: Four times a day (QID) | ORAL | Status: DC

## 2012-05-22 MED ORDER — IRON POLYSACCH CMPLX-B12-FA 150-0.025-1 MG PO CAPS: 1.0000 | ORAL_CAPSULE | Freq: Every day | ORAL | Status: DC

## 2012-05-22 MED ORDER — MEDROXYPROGESTERONE ACETATE 150 MG/ML IM SUSP: 150.0000 mg | Freq: Once | INTRAMUSCULAR | Status: DC

## 2012-05-22 NOTE — H&P (Signed)
Courtney Clark is a 22 y.o.[place] female presenting in active labor at [redacted]w[redacted]d.  Accompanied by her s.o. "Courtney Clark" who speaks some English, and he states she was able to sleep some overnight and awoke w/ ctxs early this morning, 2-3 min apart.  No VB, but reports questionable yellow d/c/leakage yesterday.  GFM.  No UTI or PIH s/s. Pt was initially 3/50% on arrival to MAU, and after recheck, cx=4/100%.  Prenatal course: Pt entered care late at [facility] at 22 weeks.  Had normal anatomy u/s at [redacted]w[redacted]d w/ S=D, and negative Quad screen.  Her pregnancy was overall uncomplicated.  She did have anemia and dx'd w/ Hgb E; Rx'd iron but did not start until late 3rd trimester b/c of pharmacy issues.  She had an elevated 1hr gtt, but normal 3hr gtt. She plans to formula feed, and declines circumcision.  OB Hx: G1=SVD '11 in [place]; Female "Courtney Clark" weighing between 6-7 lbs; no complications G2=current  Maternal Medical History:  Reason for admission: Contractions.   Contractions: Onset was 3-5 hours ago.   Frequency: regular.   Perceived severity is strong.    Fetal activity: Perceived fetal activity is normal.      OB History   Grav Para Term Preterm Abortions TAB SAB Ect Mult Living   2 2 2  0 0 0 0 0 0 2     Past Medical History  Diagnosis Date  . Anemia     PP  . Language barrier 05/21/2012  . NSVD (normal spontaneous vaginal delivery) 05/21/2012   Past Surgical History  Procedure Laterality Date  . No past surgeries     Family History: family history includes Heart disease in her mother. Social History:  reports that she has never smoked. She has never used smokeless tobacco. She reports that she does not drink alcohol or use illicit drugs.   Prenatal Transfer Tool  Maternal Diabetes: No Genetic Screening: Normal Maternal Ultrasounds/Referrals: Normal Fetal Ultrasounds or other Referrals:  None Maternal Substance Abuse:  No Significant Maternal Medications:  None Significant Maternal Lab  Results:  Lab values include: Group B Strep negative Other Comments:  Hgb E; late to care at 22 weeks; elevated 1hr gtt=136, 3hr gtt WNL  Review of Systems  Constitutional: Negative.   HENT: Negative.   Eyes: Negative.   Respiratory: Negative.   Cardiovascular: Negative.   Gastrointestinal: Negative.   Genitourinary: Negative.   Skin: Negative.   Neurological: Negative.     Blood pressure 110/72, pulse 66, temperature 98.2 F (36.8 C), temperature source Oral, resp. rate 18, height 5\' 2"  (1.575 m), weight 135 lb (61.236 kg), last menstrual period 08/27/2011, unknown if currently breastfeeding. Maternal Exam:  Uterine Assessment: Contraction strength is moderate.  Contraction frequency is regular.   Abdomen: Patient reports no abdominal tenderness. Estimated fetal weight is 6-7lbs.   Fetal presentation: vertex  Introitus: Normal vulva. Pelvis: adequate for delivery.   Cervix: Cervix evaluated by digital exam.     Fetal Exam Fetal Monitor Review: Mode: ultrasound.   Baseline rate: 135.  Variability: moderate (6-25 bpm).   Pattern: accelerations present and no decelerations.    Fetal State Assessment: Category I - tracings are normal.     Physical Exam  Constitutional: She is oriented to person, place, and time. She appears well-developed and well-nourished. She appears distressed.  HENT:  Head: Normocephalic and atraumatic.  Eyes: Pupils are equal, round, and reactive to light.  Cardiovascular: Normal rate and regular rhythm.   Respiratory: Effort normal.  GI: Soft.  gravid  Musculoskeletal: She exhibits no edema.  Neurological: She is alert and oriented to person, place, and time. She has normal reflexes.  Skin: Skin is warm and dry.  Psychiatric: She has a normal mood and affect. Her behavior is normal. Judgment and thought content normal.    Prenatal labs: ABO, Rh: --/--/O POS, O POS (02/27 0650) Antibody: NEG (02/27 0650) Rubella: 128.8 (11/05 1202) RPR: NON  REACTIVE (02/27 0650)  HBsAg: NEGATIVE (11/05 1202)  HIV: NON REACTIVE (11/05 1202)  GBS: NEGATIVE (02/19 1648)  1hr gtt=136 3hr gtt=80, 172, 157, 141 Pap neg & cx's on 01/29/12 Quad screen neg  Assessment/Plan: 1. [redacted]w[redacted]d 2. Active labor 3. Language barrier (vietnamese) 4. GBS neg 5. Anemia  6. Hgb E  1. Admit to [facility] w/ Dr. Estanislado Pandy as attending w/ routine L&D orders 2. Support as needed; pain interventions as per routine prn pt request 3. AROM and/or Pitocin prn further augmentation 4. Anticipate SVD 5. C/w MD prn   Courtney Clark 05/22/2012, 6:56 AM

## 2012-05-22 NOTE — Progress Notes (Signed)
UR chart review completed.  

## 2012-05-28 NOTE — Discharge Summary (Signed)
Vaginal Delivery Discharge Summary  Courtney Clark  DOB:    Jan 05, 1991 MRN:    [id number] CSN:    [id number]  Date of admission:                  05/20/2012  Date of discharge:                   05/22/2012  Procedures this admission:  SVD by H.Steelman, CNM   Newborn Data:  Live born female  Birth Weight: 6 lb 15 oz (3147 g) APGAR: 9, 9  Home with mother. Name:   History of Present Illness:  Ms. Courtney Clark is a 22 y.o. female, [id number], who presents at [redacted]w[redacted]d weeks gestation. The patient has been followed at the [facility] and Gynecology division of [facility]. She was admitted onset of labor. Her pregnancy has been complicated by: none.  Hospital course:  The patient was admitted for active labor.   Her labor was not complicated. She proceeded to have a vaginal delivery of a healthy infant. Her delivery was not complicated. Her postpartum course was not complicated.  She was discharged to home on postpartum day 1 doing well.  Feeding:  breast and bottle  Contraception:  no method  Discharge hemoglobin:  Hemoglobin  Date Value Range Status  05/22/2012 9.2* 12.0 - 15.0 g/dL Final     HCT  Date Value Range Status  05/22/2012 29.1* 36.0 - 46.0 % Final    Discharge Physical Exam:   General: no distress Lochia: appropriate Uterine Fundus: firm Incision: healing well DVT Evaluation: No evidence of DVT seen on physical exam.  Intrapartum Procedures: spontaneous vaginal delivery Postpartum Procedures: none Complications-Operative and Postpartum: none  Discharge Diagnoses: Term Pregnancy-delivered  Discharge Information:  Activity:           pelvic rest Diet:                routine Medications: Ibuprofen and Iron Condition:      stable Instructions:  refer to practice specific booklet Discharge to: home  Follow-up Information   Follow up with [facility] In 6 weeks.   Contact information:   [address] [place] [address] [phone]       Malissa Hippo 05/28/2012

## 2013-10-21 DIAGNOSIS — Z331 Pregnant state, incidental: Secondary | ICD-10-CM

## 2014-01-24 ENCOUNTER — Encounter (HOSPITAL_COMMUNITY): Payer: Self-pay | Admitting: *Deleted

## 2016-12-24 ENCOUNTER — Encounter (HOSPITAL_COMMUNITY): Payer: Self-pay | Admitting: Emergency Medicine

## 2016-12-24 ENCOUNTER — Ambulatory Visit (HOSPITAL_COMMUNITY)
Admission: EM | Admit: 2016-12-24 | Discharge: 2016-12-24 | Disposition: A | Discharge status: Home / Self Care | Payer: Self-pay | Attending: Family Medicine | Admitting: Family Medicine

## 2016-12-24 DIAGNOSIS — Z3201 Encounter for pregnancy test, result positive: Secondary | ICD-10-CM

## 2016-12-24 LAB — POCT PREGNANCY, URINE: PREG TEST UR: POSITIVE — AB

## 2016-12-24 NOTE — ED Triage Notes (Signed)
Family member says he need paperwork so patient can apply for medicaid

## 2016-12-24 NOTE — Discharge Instructions (Signed)
Based on your last menstrual period of October 31, 2016: Estimated Gestational Age: 25 weeks, 4 days Estimated Due Date: Aug 08, 2017

## 2016-12-24 NOTE — ED Provider Notes (Signed)
  [facility]   [id number] 12/24/16 Arrival Time: 1240  ASSESSMENT & PLAN:  1. Positive pregnancy test    Copy of + UPT given. Will self-schedule OB follow up. She may follow up here as needed. Reviewed expectations re: course of current medical issues. Questions answered. Outlined signs and symptoms indicating need for more acute intervention. Patient verbalized understanding. After Visit Summary given.   SUBJECTIVE:  Courtney Clark is a 26 y.o. female who requests pregnancy test. Positive test at home. No n/v. No abdominal pain or vaginal bleeding. Patient's last menstrual period was 10/30/2016.   Past Surgical History:  Procedure Laterality Date  . NO PAST SURGERIES      ROS: As per HPI.  OBJECTIVE:  Vitals:   12/24/16 1258  BP: 115/78  Pulse: 85  Resp: 18  Temp: 98.1 F (36.7 C)  TempSrc: Oral  SpO2: 100%    General appearance: alert; no distress Lungs: clear to auscultation bilaterally Heart: regular rate and rhythm Abdomen: soft; non-distended; no tenderness Extremities: no cyanosis or edema; symmetrical with no gross deformities Skin: warm and dry Psychological: alert and cooperative; normal mood and affect  Labs: Labs Reviewed  POCT PREGNANCY, URINE - Abnormal; Notable for the following:       Result Value   Preg Test, Ur POSITIVE (*)    All other components within normal limits   No Known Allergies                                             Past Medical History:  Diagnosis Date  . Anemia    PP  . Language barrier 05/21/2012  . NSVD (normal spontaneous vaginal delivery) 05/21/2012   Social History   Social History  . Marital status: Married    Spouse name: [name]  . Number of children: 1  . Years of education: 5   Occupational History  . HOMEMAKER    Social History Main Topics  . Smoking status: Never Smoker  . Smokeless tobacco: Never Used  . Alcohol use No  . Drug use: No  . Sexual activity: Yes    Birth control/  protection: None   Other Topics Concern  . Not on file   Social History Narrative  . No narrative on file   Family History  Problem Relation Age of Onset  . Heart disease Mother      Mardella Layman, MD 12/24/16 [phone]

## 2017-02-25 LAB — OB RESULTS CONSOLE RPR: RPR: NONREACTIVE

## 2017-02-25 LAB — OB RESULTS CONSOLE ABO/RH: RH Type: POSITIVE

## 2017-02-25 LAB — OB RESULTS CONSOLE ANTIBODY SCREEN: ANTIBODY SCREEN: NEGATIVE

## 2017-02-25 LAB — OB RESULTS CONSOLE HIV ANTIBODY (ROUTINE TESTING): HIV: NONREACTIVE

## 2017-02-25 LAB — OB RESULTS CONSOLE GC/CHLAMYDIA
Chlamydia: NEGATIVE
GC PROBE AMP, GENITAL: NEGATIVE

## 2017-02-25 LAB — OB RESULTS CONSOLE RUBELLA ANTIBODY, IGM: RUBELLA: IMMUNE

## 2017-02-25 LAB — OB RESULTS CONSOLE HEPATITIS B SURFACE ANTIGEN: Hepatitis B Surface Ag: NEGATIVE

## 2017-03-11 ENCOUNTER — Encounter (HOSPITAL_COMMUNITY): Payer: Self-pay

## 2017-03-25 NOTE — L&D Delivery Note (Addendum)
Delivery Note At  a viable female was delivered via  (Presentation: OA ).  APGAR: 8 , 9; weight pending   Placenta status: spontaneously delivered intact.  Cord: 3 vessel with the following complications: nuchal cord x1, somersault maneuver Cord pH: Not obtained  Anesthesia:  None Episiotomy:  None Lacerations:  Posterior vaginal abrasion  Suture Repair: 3.0 vicryl rapide Est. Blood Loss (mL):  430  Mom to postpartum.  Baby to Couplet care / Skin to Skin.  Janeece Riggers 08/12/2017, 8:08 AM  0900 Asked to see pt about increased bleeding totaling about 1100cc.  Cytotec had been placed by Kathalene Frames rectally at time of delivery d/t risk of atony.  Christy, RN initiated fundal massage prior to my arrival and bleeding improved but still present.  IM Methergine given x 1 and will also place on po methergine series.

## 2017-06-04 ENCOUNTER — Other Ambulatory Visit (HOSPITAL_COMMUNITY): Payer: Self-pay | Admitting: Obstetrics & Gynecology

## 2017-06-04 DIAGNOSIS — Z3689 Encounter for other specified antenatal screening: Secondary | ICD-10-CM

## 2017-06-04 DIAGNOSIS — Z3A31 31 weeks gestation of pregnancy: Secondary | ICD-10-CM

## 2017-06-04 DIAGNOSIS — O26843 Uterine size-date discrepancy, third trimester: Secondary | ICD-10-CM

## 2017-06-09 ENCOUNTER — Ambulatory Visit (HOSPITAL_COMMUNITY): Payer: Self-pay

## 2017-06-11 ENCOUNTER — Encounter (HOSPITAL_COMMUNITY): Payer: Self-pay | Admitting: Obstetrics & Gynecology

## 2017-06-16 ENCOUNTER — Encounter: Payer: Self-pay | Admitting: Registered"

## 2017-06-16 ENCOUNTER — Encounter: Payer: Medicaid Other | Attending: Obstetrics & Gynecology | Admitting: Registered"

## 2017-06-16 DIAGNOSIS — O99810 Abnormal glucose complicating pregnancy: Secondary | ICD-10-CM | POA: Diagnosis not present

## 2017-06-16 DIAGNOSIS — Z713 Dietary counseling and surveillance: Secondary | ICD-10-CM | POA: Diagnosis present

## 2017-06-16 DIAGNOSIS — R7309 Other abnormal glucose: Secondary | ICD-10-CM | POA: Insufficient documentation

## 2017-06-16 NOTE — Progress Notes (Signed)
Patient was seen on 06/16/2017 for Gestational Diabetes self-management education at [facility]. The following learning objectives were met by the patient during this course:   States the definition of Gestational Diabetes  States why dietary management is important in controlling blood glucose  Describes the effects each nutrient has on blood glucose levels  Demonstrates ability to create a balanced meal plan  Demonstrates carbohydrate counting   States when to check blood glucose levels  Demonstrates proper blood glucose monitoring techniques  States the effect of stress and exercise on blood glucose levels  States the importance of limiting caffeine and abstaining from alcohol and smoking  Blood glucose monitor given: Accu-Chek Guide Lot # [id number] Exp: 06-12-2018 Blood glucose reading: 102  Patient instructed to monitor glucose levels: FBS: 60 - <95; 1 hour: <140; 2 hour: <120  Patient received handouts:  Nutrition Diabetes and Pregnancy, including carb counting list  Patient will be seen for follow-up as needed. 

## 2017-06-23 ENCOUNTER — Encounter (HOSPITAL_COMMUNITY): Payer: Self-pay | Admitting: *Deleted

## 2017-06-25 ENCOUNTER — Encounter (HOSPITAL_COMMUNITY): Payer: Self-pay

## 2017-06-25 ENCOUNTER — Ambulatory Visit (HOSPITAL_COMMUNITY)
Admission: RE | Admit: 2017-06-25 | Discharge: 2017-06-25 | Disposition: A | Discharge status: Home / Self Care | Payer: Medicaid Other | Source: Ambulatory Visit | Attending: Obstetrics & Gynecology | Admitting: Obstetrics & Gynecology

## 2017-06-25 DIAGNOSIS — Z3A31 31 weeks gestation of pregnancy: Secondary | ICD-10-CM

## 2017-06-25 DIAGNOSIS — Z3A33 33 weeks gestation of pregnancy: Secondary | ICD-10-CM | POA: Insufficient documentation

## 2017-06-25 DIAGNOSIS — O3663X0 Maternal care for excessive fetal growth, third trimester, not applicable or unspecified: Secondary | ICD-10-CM | POA: Diagnosis not present

## 2017-06-25 DIAGNOSIS — O26843 Uterine size-date discrepancy, third trimester: Secondary | ICD-10-CM | POA: Diagnosis present

## 2017-06-25 DIAGNOSIS — Z3689 Encounter for other specified antenatal screening: Secondary | ICD-10-CM | POA: Insufficient documentation

## 2017-06-25 NOTE — Addendum Note (Signed)
Encounter addended by: Earley Brookealrymple, Jaymond Waage S on: 06/25/2017 11:36 AM  Actions taken: Imaging Exam ended

## 2017-07-10 LAB — OB RESULTS CONSOLE GBS: STREP GROUP B AG: NEGATIVE

## 2017-08-11 ENCOUNTER — Telehealth (HOSPITAL_COMMUNITY): Payer: Self-pay | Admitting: *Deleted

## 2017-08-11 ENCOUNTER — Other Ambulatory Visit: Payer: Self-pay | Admitting: Obstetrics and Gynecology

## 2017-08-11 NOTE — Telephone Encounter (Signed)
Preadmission screen  

## 2017-08-12 ENCOUNTER — Inpatient Hospital Stay (HOSPITAL_COMMUNITY)
Admission: AD | Admit: 2017-08-12 | Discharge: 2017-08-14 | DRG: 806 | Disposition: A | Discharge status: Home / Self Care | Payer: Medicaid Other | Source: Ambulatory Visit | Attending: Obstetrics and Gynecology | Admitting: Obstetrics and Gynecology

## 2017-08-12 ENCOUNTER — Encounter (HOSPITAL_COMMUNITY): Payer: Self-pay

## 2017-08-12 ENCOUNTER — Other Ambulatory Visit: Payer: Self-pay

## 2017-08-12 DIAGNOSIS — O24420 Gestational diabetes mellitus in childbirth, diet controlled: Principal | ICD-10-CM | POA: Diagnosis present

## 2017-08-12 DIAGNOSIS — D649 Anemia, unspecified: Secondary | ICD-10-CM | POA: Diagnosis present

## 2017-08-12 DIAGNOSIS — O9902 Anemia complicating childbirth: Secondary | ICD-10-CM | POA: Diagnosis present

## 2017-08-12 DIAGNOSIS — Z3A40 40 weeks gestation of pregnancy: Secondary | ICD-10-CM

## 2017-08-12 LAB — CBC
HCT: 36.2 % (ref 36.0–46.0)
HCT: 35.9 % — ABNORMAL LOW (ref 36.0–46.0)
HEMOGLOBIN: 12 g/dL (ref 12.0–15.0)
Hemoglobin: 12.4 g/dL (ref 12.0–15.0)
MCH: 23.7 pg — AB (ref 26.0–34.0)
MCH: 24.2 pg — AB (ref 26.0–34.0)
MCHC: 34.3 g/dL (ref 30.0–36.0)
MCHC: 33.4 g/dL (ref 30.0–36.0)
MCV: 69.2 fL — ABNORMAL LOW (ref 78.0–100.0)
MCV: 72.5 fL — AB (ref 78.0–100.0)
PLATELETS: 196 10*3/uL (ref 150–400)
PLATELETS: 175 10*3/uL (ref 150–400)
RBC: 5.23 MIL/uL — AB (ref 3.87–5.11)
RBC: 4.95 MIL/uL (ref 3.87–5.11)
RDW: 14.7 % (ref 11.5–15.5)
RDW: 14.5 % (ref 11.5–15.5)
WBC: 9.1 10*3/uL (ref 4.0–10.5)
WBC: 19.3 10*3/uL — ABNORMAL HIGH (ref 4.0–10.5)

## 2017-08-12 LAB — TYPE AND SCREEN
ABO/RH(D): O POS
Antibody Screen: NEGATIVE

## 2017-08-12 LAB — RPR: RPR: NONREACTIVE

## 2017-08-12 LAB — GLUCOSE, CAPILLARY: Glucose-Capillary: 100 mg/dL — ABNORMAL HIGH (ref 65–99)

## 2017-08-12 LAB — POCT FERN TEST: POCT FERN TEST: NEGATIVE

## 2017-08-12 MED ORDER — ONDANSETRON HCL 4 MG/2ML IJ SOLN: 4.0000 mg | Freq: Four times a day (QID) | INTRAMUSCULAR | Status: DC | PRN

## 2017-08-12 MED ORDER — BENZOCAINE-MENTHOL 20-0.5 % EX AERO: 1.0000 "application " | INHALATION_SPRAY | CUTANEOUS | Status: DC | PRN

## 2017-08-12 MED ORDER — METHYLERGONOVINE MALEATE 0.2 MG PO TABS
0.2000 mg | ORAL_TABLET | Freq: Four times a day (QID) | ORAL | Status: AC
  Administered 2017-08-12 (×4): 0.2 mg via ORAL
  Filled 2017-08-12 (×4): qty 1

## 2017-08-12 MED ORDER — TETANUS-DIPHTH-ACELL PERTUSSIS 5-2.5-18.5 LF-MCG/0.5 IM SUSP
0.5000 mL | Freq: Once | INTRAMUSCULAR | Status: AC
  Administered 2017-08-13: 0.5 mL via INTRAMUSCULAR
  Filled 2017-08-12: qty 0.5

## 2017-08-12 MED ORDER — SENNOSIDES-DOCUSATE SODIUM 8.6-50 MG PO TABS
2.0000 | ORAL_TABLET | ORAL | Status: DC
  Administered 2017-08-12 – 2017-08-13 (×2): 2 via ORAL
  Filled 2017-08-12 (×2): qty 2

## 2017-08-12 MED ORDER — WITCH HAZEL-GLYCERIN EX PADS: 1.0000 "application " | MEDICATED_PAD | CUTANEOUS | Status: DC | PRN

## 2017-08-12 MED ORDER — MISOPROSTOL 200 MCG PO TABS
ORAL_TABLET | ORAL | Status: AC
  Administered 2017-08-12: 800 ug
  Filled 2017-08-12: qty 5

## 2017-08-12 MED ORDER — FLEET ENEMA 7-19 GM/118ML RE ENEM: 1.0000 | ENEMA | Freq: Every day | RECTAL | Status: DC | PRN

## 2017-08-12 MED ORDER — LACTATED RINGERS IV SOLN: 500.0000 mL | INTRAVENOUS | Status: DC | PRN

## 2017-08-12 MED ORDER — OXYCODONE-ACETAMINOPHEN 5-325 MG PO TABS: 2.0000 | ORAL_TABLET | ORAL | Status: DC | PRN

## 2017-08-12 MED ORDER — ACETAMINOPHEN 325 MG PO TABS: 650.0000 mg | ORAL_TABLET | ORAL | Status: DC | PRN

## 2017-08-12 MED ORDER — COCONUT OIL OIL: 1.0000 "application " | TOPICAL_OIL | Status: DC | PRN

## 2017-08-12 MED ORDER — DIPHENHYDRAMINE HCL 25 MG PO CAPS
25.0000 mg | ORAL_CAPSULE | Freq: Four times a day (QID) | ORAL | Status: DC | PRN
Start: 2017-08-12 — End: 2017-08-14

## 2017-08-12 MED ORDER — LACTATED RINGERS IV SOLN
INTRAVENOUS | Status: DC
  Administered 2017-08-12: 04:00:00 via INTRAVENOUS

## 2017-08-12 MED ORDER — OXYTOCIN 40 UNITS IN LACTATED RINGERS INFUSION - SIMPLE MED
2.5000 [IU]/h | INTRAVENOUS | Status: DC
  Administered 2017-08-12: 39.96 [IU]/h via INTRAVENOUS
  Administered 2017-08-12: 2.5 [IU]/h via INTRAVENOUS
  Filled 2017-08-12 (×2): qty 1000

## 2017-08-12 MED ORDER — FENTANYL CITRATE (PF) 100 MCG/2ML IJ SOLN
25.0000 ug | INTRAMUSCULAR | Status: DC | PRN
  Administered 2017-08-12: 25 ug via INTRAVENOUS

## 2017-08-12 MED ORDER — OXYTOCIN BOLUS FROM INFUSION
500.0000 mL | Freq: Once | INTRAVENOUS | Status: AC
  Administered 2017-08-12: 500 mL via INTRAVENOUS

## 2017-08-12 MED ORDER — PRENATAL MULTIVITAMIN CH
1.0000 | ORAL_TABLET | Freq: Every day | ORAL | Status: DC
  Administered 2017-08-13 – 2017-08-14 (×2): 1 via ORAL
  Filled 2017-08-12 (×2): qty 1

## 2017-08-12 MED ORDER — FENTANYL CITRATE (PF) 100 MCG/2ML IJ SOLN
INTRAMUSCULAR | Status: AC
  Filled 2017-08-12: qty 2

## 2017-08-12 MED ORDER — IBUPROFEN 600 MG PO TABS
600.0000 mg | ORAL_TABLET | Freq: Four times a day (QID) | ORAL | Status: DC
  Administered 2017-08-12 – 2017-08-14 (×9): 600 mg via ORAL
  Filled 2017-08-12 (×9): qty 1

## 2017-08-12 MED ORDER — SIMETHICONE 80 MG PO CHEW: 80.0000 mg | CHEWABLE_TABLET | ORAL | Status: DC | PRN

## 2017-08-12 MED ORDER — ZOLPIDEM TARTRATE 5 MG PO TABS: 5.0000 mg | ORAL_TABLET | Freq: Every evening | ORAL | Status: DC | PRN

## 2017-08-12 MED ORDER — OXYCODONE-ACETAMINOPHEN 5-325 MG PO TABS: 1.0000 | ORAL_TABLET | ORAL | Status: DC | PRN

## 2017-08-12 MED ORDER — MISOPROSTOL 200 MCG PO TABS: 800.0000 ug | ORAL_TABLET | Freq: Once | ORAL | Status: DC

## 2017-08-12 MED ORDER — METHYLERGONOVINE MALEATE 0.2 MG/ML IJ SOLN
0.2000 mg | Freq: Once | INTRAMUSCULAR | Status: AC
  Administered 2017-08-12: 0.2 mg via INTRAMUSCULAR

## 2017-08-12 MED ORDER — ONDANSETRON HCL 4 MG/2ML IJ SOLN: 4.0000 mg | INTRAMUSCULAR | Status: DC | PRN

## 2017-08-12 MED ORDER — ONDANSETRON HCL 4 MG PO TABS: 4.0000 mg | ORAL_TABLET | ORAL | Status: DC | PRN

## 2017-08-12 MED ORDER — SOD CITRATE-CITRIC ACID 500-334 MG/5ML PO SOLN: 30.0000 mL | ORAL | Status: DC | PRN

## 2017-08-12 MED ORDER — DIBUCAINE 1 % RE OINT: 1.0000 "application " | TOPICAL_OINTMENT | RECTAL | Status: DC | PRN

## 2017-08-12 MED ORDER — LIDOCAINE HCL (PF) 1 % IJ SOLN
30.0000 mL | INTRAMUSCULAR | Status: AC | PRN
  Administered 2017-08-12: 30 mL via SUBCUTANEOUS
  Filled 2017-08-12: qty 30

## 2017-08-12 NOTE — Progress Notes (Signed)
Jonelle Sidle, RN and Delton Prairie assumed care for patient at this time

## 2017-08-12 NOTE — Progress Notes (Signed)
Patient called out to use the bathroom. Patient became dizzy but went away after sitting still for a few minutes. Stood at bedside and used bedside commode. Voided . Asked patient to call out when she needs to void again.

## 2017-08-12 NOTE — MAU Note (Signed)
Pt reports LOF and contractions that started at 1am. Was 2cm yesterday.

## 2017-08-12 NOTE — Progress Notes (Signed)
Courtney Clark is a 27 y.o. G3P2002 at [redacted]w[redacted]d by admitted for active labor  Subjective:   Objective: Vitals:   08/12/17 0210 08/12/17 0400 08/12/17 0403 08/12/17 0404  BP: 136/82 133/81    Pulse: 75 75    Resp: 18     Temp: 98.1 F (36.7 C) 99.4 F (37.4 C)    TempSrc: Oral Oral    SpO2: 100%     Weight:   63 kg (139 lb) 64 kg (141 lb)  Height:    (1.6 m)    Results for orders placed or performed during the hospital encounter of 08/12/17 (from the past 24 hour(s))  CBC     Status: Abnormal   Collection Time: 08/12/17  3:30 AM  Result Value Ref Range   WBC 9.1 4.0 - 10.5 K/uL   RBC 5.23 (H) 3.87 - 5.11 MIL/uL   Hemoglobin 12.4 12.0 - 15.0 g/dL   HCT 32.4 40.1 - 02.7 %   MCV 69.2 (L) 78.0 - 100.0 fL   MCH 23.7 (L) 26.0 - 34.0 pg   MCHC 34.3 30.0 - 36.0 g/dL   RDW 25.3 66.4 - 40.3 %   Platelets 196 150 - 400 K/uL  Type and screen [facility]      Status: None   Collection Time: 08/12/17  3:30 AM  Result Value Ref Range   ABO/RH(D) O POS    Antibody Screen NEG    Sample Expiration      08/15/2017 Performed at [facility], [address], [place], [place] [address]   POCT fern test     Status: None   Collection Time: 08/12/17  3:42 AM  Result Value Ref Range   POCT Fern Test Negative = intact amniotic membranes      FHT:  FHR: 150 bpm, variability: moderate,  accelerations:  Present,  decelerations:  Present occasional early decels UC:   irregular, every 4-6 minutes SVE:   Dilation: Lip/rim Effacement (%): 100 Station: -1 Exam by:: Gearldine Bienenstock, RN  Labs: Lab Results  Component Value Date   WBC 9.1 08/12/2017   HGB 12.4 08/12/2017   HCT 36.2 08/12/2017   MCV 69.2 (L) 08/12/2017   PLT 196 08/12/2017    Assessment / Plan: Spontaneous labor, progressing normally  Labor: Progressing normally Preeclampsia:  no signs or symptoms of toxicity, intake and ouput balanced and labs stable Fetal Wellbeing:  Category I Pain Control:  Labor  support without medications I/D:  n/a Anticipated MOD:  NSVD  Janeece Riggers 08/12/2017, 6:13 AM

## 2017-08-12 NOTE — H&P (Addendum)
Subjective:  Paetyn Pietrzak is a 27 y.o. G3 P2 female with EDC 08/08/17 at 40 and 4/[redacted] weeks gestation who is being admitted for labor management.  Her current obstetrical history is significant for gestational DM.  Patient reports contractions since 0100.   Fetal Movement: normal. Patient desires unmedicated birth.     Objective/Prenatal Transfer Tool:    Maternal Diabetes: Yes, GDM A1 Genetic Screening: Quad screen WNL Maternal Ultrasounds/Referrals: None  Fetal Ultrasounds or other Referrals: At [facility]: 07/24/17- Growth U/S, EFW 6lb15oz, AFI 17 Maternal Substance Abuse: None Significant Maternal Medications:  None Significant Maternal Lab Results:  None Other Comments:  None    Vitals:   08/12/17 0210 08/12/17 0403 08/12/17 0404  BP: 136/82    Pulse: 75    Resp: 18    Temp: 98.1 F (36.7 C)    TempSrc: Oral    SpO2: 100%    Weight:  63 kg (139 lb) 64 kg (141 lb)  Height:   (1.6 m)    Results for orders placed or performed during the hospital encounter of 08/12/17 (from the past 24 hour(s))  CBC     Status: Abnormal   Collection Time: 08/12/17  3:30 AM  Result Value Ref Range   WBC 9.1 4.0 - 10.5 K/uL   RBC 5.23 (H) 3.87 - 5.11 MIL/uL   Hemoglobin 12.4 12.0 - 15.0 g/dL   HCT 16.1 09.6 - 04.5 %   MCV 69.2 (L) 78.0 - 100.0 fL   MCH 23.7 (L) 26.0 - 34.0 pg   MCHC 34.3 30.0 - 36.0 g/dL   RDW 40.9 81.1 - 91.4 %   Platelets 196 150 - 400 K/uL  POCT fern test     Status: None   Collection Time: 08/12/17  3:42 AM  Result Value Ref Range   POCT Fern Test Negative = intact amniotic membranes    Review of Systems  Gastrointestinal: Positive for abdominal pain.  All other systems reviewed and are negative.  Physical Exam  Constitutional: She is oriented to person, place, and time and well-developed, well-nourished, and in no distress.  HENT:  Head: Normocephalic and atraumatic.  Eyes: Pupils are equal, round, and reactive to light.  Cardiovascular: Normal rate and regular  rhythm.  Pulmonary/Chest: Effort normal and breath sounds normal.  Abdominal: Soft. Bowel sounds are normal.  Genitourinary: Vagina normal and cervix normal.  Musculoskeletal: Normal range of motion.  Neurological: She is alert and oriented to person, place, and time.  Skin: Skin is warm and dry.  Psychiatric: Mood, memory, affect and judgment normal.    FHT: 145 moderate beat to beat variability, +accels, + decels (occasional early decels) overall reassuring   Uterine Size: size equals dates  Presentations: cephalic  Cervix:    Dilation: 5   Effacement: 100%   Station:  -2   Consistency: soft   Position: anterior   Lab Review  O, Rh+, Rubella-immune, Hepatitis B surface antigen non-reactive, GBS negative  AFP:NML  One hour GTT: elevated with gestational diabetes on 3 hour GTT, she is diet controlled, sugars have been in good control.    Assessment/Plan:  40 and 4/[redacted] weeks gestation. Active phase labor. Obstetrical history significant for gestational DM.   Category 1 FHTs Admit to L&D    Risks, benefits, alternatives and possible complications have been discussed in detail with the patient.  Pre-admission, admission, and post admission procedures and expectations were discussed in detail.  All questions answered, all appropriate consents will be signed at  the Hospital. Admission is planned for today.  Anticipate vaginal delivery.

## 2017-08-12 NOTE — Progress Notes (Signed)
Phone stratus interpreter "veronica" used for admission paperwork, bottle feeding instructions, medications, plan of care. Patient's husband said he is comfortable translating small things like asking for meals and pain assessments but if it requires education he doesn't want to interpret. Asked for patient's ;unch order and she does not want to eat at this time. She said she would call me when she is ready to order.

## 2017-08-13 ENCOUNTER — Inpatient Hospital Stay (HOSPITAL_COMMUNITY): Admission: RE | Admit: 2017-08-13 | Payer: Medicaid Other | Source: Ambulatory Visit

## 2017-08-13 LAB — CBC
HCT: 30.7 % — ABNORMAL LOW (ref 36.0–46.0)
Hemoglobin: 10.5 g/dL — ABNORMAL LOW (ref 12.0–15.0)
MCH: 23.8 pg — AB (ref 26.0–34.0)
MCHC: 34.2 g/dL (ref 30.0–36.0)
MCV: 69.6 fL — ABNORMAL LOW (ref 78.0–100.0)
Platelets: 195 10*3/uL (ref 150–400)
RBC: 4.41 MIL/uL (ref 3.87–5.11)
RDW: 14.2 % (ref 11.5–15.5)
WBC: 14.4 10*3/uL — ABNORMAL HIGH (ref 4.0–10.5)

## 2017-08-13 LAB — GLUCOSE, RANDOM: Glucose, Bld: 75 mg/dL (ref 65–99)

## 2017-08-13 NOTE — Progress Notes (Signed)
Subjective: Courtney Clark is a 27 y.o. G3P3003 at [redacted]w[redacted]d was admitted for active labor with pmh of GDM, and now postpartum course has been unremarkable over last night. Pt did have PPH of EBL , placed on po methergine and Cytotec vaginally, bleeding since has resolved. Pt resting quietly in bed with family and infant in bassinet at bedside. Pt tolerating PO feeds well, endorse mild cramps relieved by po motrin, passing gas and urinating well. Vaginal bleeding uncontrol, no large clots and no concern of increased bleeding. Pt denies feeling lightheaded, no HA, or extreme fatigue, denies cp, sob.  Postpartum Day 1: Vaginal delivery, perineal laceration Patient up ad lib, reports no syncope or dizziness. Feeding:  Bottle now/ may plan to breast feed at home Contraceptive plan:  Undecided   Objective: Vital signs in last 24 hours: Temp:  [98.2 F (36.8 C)-98.8 F (37.1 C)] 98.4 F (36.9 C) (05/22 0856) Pulse Rate:  [66-77] 68 (05/22 0856) Resp:  [16-18] 18 (05/22 0856) BP: (94-117)/(62-75) 99/73 (05/22 0856) SpO2:  [97 %] 97 % (05/21 1510)  Physical Exam:  General: alert, cooperative and appears stated age  Cardiac: 2/6 grade systolic murmur present  Lochia: appropriate Uterine Fundus: firm Perineum: healing well, no significant drainage, no dehiscence, no significant erythema DVT Evaluation: No evidence of DVT seen on physical exam. Negative Homan's sign. No cords or calf tenderness. No significant calf/ankle edema.   CBC Latest Ref Rng & Units 08/13/2017 08/12/2017 08/12/2017  WBC 4.0 - 10.5 K/uL 14.4(H) 19.3(H) 9.1  Hemoglobin 12.0 - 15.0 g/dL 10.5(L) 12.0 12.4  Hematocrit 36.0 - 46.0 % 30.7(L) 35.9(L) 36.2  Platelets 150 - 400 K/uL 195 175 196   CBG (last 3)  Recent Labs    08/12/17 0726  GLUCAP 100*    Assessment/Plan: Status post vaginal delivery day 1. GDM: Last BS was 75, plan to D/C in AM is sugars remain normal  Stable Continue current care. Plan for discharge  tomorrow, Breastfeeding, Lactation consult and Contraception undecided     Teller Wakefield, JADECNM 08/13/2017, 12:01 PM

## 2017-08-14 MED ORDER — IBUPROFEN 600 MG PO TABS: 600.0000 mg | ORAL_TABLET | Freq: Four times a day (QID) | ORAL | 0 refills | Status: DC | PRN

## 2017-08-14 NOTE — Lactation Note (Signed)
This note was copied from a baby's chart. Lactation Consultation Note  Patient Name: Courtney Clark [id number] Date: 08/14/2017   Infant has received bottles since birth except one breastfeeding today at 0330. I asked [name], RN if Mom would like to see lactation. RN made them aware that lactation is available if they'd like to see [place]. Dad reports that Mom breastfed her 2 previous children for 2-3 months each and that they do mixed feeding with their children. RN observed an additional feeding and said that infant was breastfeeding "beautifully."    [name] [facility] 08/14/2017, 8:30 AM

## 2017-08-14 NOTE — Discharge Summary (Addendum)
OB Discharge Summary     Patient Name: Courtney Clark DOB: 12-Aug-1990 MRN: [id number]  Date of admission: 08/12/2017 Delivering MD: Osborn Coho   Date of discharge: 08/14/2017  Admitting diagnosis: 40.4 WEEKS CTX Intrauterine pregnancy: [redacted]w[redacted]d     Secondary diagnosis:  Principal Problem:   Vaginal delivery Active Problems:   Labor without complication   Postpartum care following vaginal delivery  Additional problems: PPH     Discharge diagnosis: Term Pregnancy Delivered, GDM A1, Anemia and PPH                                                                                                Post partum procedures:merthergine series, PO 7 cytotec given for PPH  Augmentation: none  Complications: Hemorrhage>103mL  Hospital course:  Onset of Labor With Vaginal Delivery     27 y.o. yo [id number] at [redacted]w[redacted]d was admitted in Active Labor on 08/12/2017. Patient had an uncomplicated labor course as follows:  Membrane Rupture Time/Date: 6:25 AM ,08/12/2017   Intrapartum Procedures: Episiotomy: None [1]                                         Lacerations:  Perineal [11]  Patient had a delivery of a Viable infant. 08/12/2017  Information for the patient's newborn:  Akila, Batta Girl Diora [[id number]]  Delivery Method: Vaginal, Spontaneous(Filed from Delivery Summary)    Pateint had an uncomplicated postpartum course.  She is ambulating, tolerating a regular diet, passing flatus, and urinating well. Patient is discharged home in stable condition on 08/14/17.   Physical exam  Vitals:   08/13/17 0543 08/13/17 0856 08/13/17 1816 08/14/17 0515  BP: 94/62 99/73 107/76 (!) 90/53  Pulse: 66 68 74 62  Resp: Temp: 98.2 F (36.8 C) 98.4 F (36.9 C) 98.3 F (36.8 C) 98.6 F (37 C)  TempSrc: Oral Oral Oral Oral  SpO2:    100%  Weight:      Height:       General: alert, cooperative and no distress Lochia: appropriate Uterine Fundus: firm Vaginal laceration: Healing well with no  significant drainage DVT Evaluation: No evidence of DVT seen on physical exam. Negative Homan's sign. No cords or calf tenderness. No significant calf/ankle edema. Labs: Lab Results  Component Value Date   WBC 14.4 (H) 08/13/2017   HGB 10.5 (L) 08/13/2017   HCT 30.7 (L) 08/13/2017   MCV 69.6 (L) 08/13/2017   PLT 195 08/13/2017   CMP Latest Ref Rng & Units 08/13/2017  Glucose 65 - 99 mg/dL 75    Discharge instruction: per After Visit Summary and "Baby and Me Booklet".  After visit meds:  Allergies as of 08/14/2017   No Known Allergies     Medication List    STOP taking these medications   Iron Polysacch Cmplx-B12-FA 150-0.025-1 MG Caps     TAKE these medications   ibuprofen 600 MG tablet Commonly known as:  ADVIL,MOTRIN Take 1 tablet (600 mg total) by mouth every 6 (  six) hours as needed for mild pain, moderate pain or cramping.   prenatal multivitamin Tabs tablet Take 1 tablet by mouth daily.        Diet: routine diet  Activity: Advance as tolerated. Pelvic rest for 6 weeks.   Outpatient follow up:6 weeks Follow up Appt:No future appointments. Follow up Visit: 6 weeks PPV Take ferrous sulfate OTC at home. For blood loss.   Postpartum contraception: Undecided  Newborn Data: Live born female  Birth Weight: 7 lb 8.8 oz (3425 g) APGAR: 9, 9  Newborn Delivery   Birth date/time:  08/12/2017 07:33:00 Delivery type:  Vaginal, Spontaneous     Baby Feeding: Bottle Disposition:home with mother Pt discharged home in stable condition.   08/14/2017 Janeece Riggers, CNM

## 2017-09-23 DIAGNOSIS — D649 Anemia, unspecified: Secondary | ICD-10-CM | POA: Diagnosis not present

## 2017-10-31 ENCOUNTER — Encounter (HOSPITAL_COMMUNITY): Payer: Self-pay

## 2019-03-26 NOTE — L&D Delivery Note (Signed)
OB/GYN Faculty Practice Delivery Note  Courtney Clark is a 30 y.o. [id number] s/p SVD at [redacted]w[redacted]d. She was admitted for active labor.   ROM: 0h 63m with light MSF fluid GBS Status: neg Maximum Maternal Temperature: 98.9  Labor Progress: Marland Kitchen [name] arrived in active labor at 7cm. She progressed spontaneously to SVD with SROM for light MSF just prior to delivery.  Delivery Date/Time: November 29, 2019 at 2338 Delivery: Called to room and patient was complete and pushing. Head delivered LOA. No nuchal cord present. Shoulder and body delivered in usual fashion. Infant with spontaneous cry, placed on mother's abdomen, dried and stimulated. Cord clamped x 2 after 1-minute delay, and cut by FOB. Cord blood drawn. Placenta delivered spontaneously with gentle cord traction. Fundus firm with massage and Pitocin. Labia, perineum, vagina, and cervix inspected and found to be intact.   Placenta: spont, intact Complications: none Lacerations: none EBL: 50cc Analgesia: none  Postpartum Planning [x]  message to sent to schedule follow-up   Infant: boy  APGARs 9/9  2860g (6lb 4.9oz)  05-20-1997, CNM  11/30/2019 12:07 AM

## 2019-08-17 ENCOUNTER — Other Ambulatory Visit: Payer: Self-pay

## 2019-08-17 ENCOUNTER — Ambulatory Visit (INDEPENDENT_AMBULATORY_CARE_PROVIDER_SITE_OTHER): Payer: Self-pay | Admitting: Nurse Practitioner

## 2019-08-17 ENCOUNTER — Encounter: Payer: Self-pay | Admitting: Nurse Practitioner

## 2019-08-17 ENCOUNTER — Ambulatory Visit: Payer: Medicaid Other

## 2019-08-17 ENCOUNTER — Other Ambulatory Visit (HOSPITAL_COMMUNITY)
Admission: RE | Admit: 2019-08-17 | Discharge: 2019-08-17 | Disposition: A | Discharge status: Home / Self Care | Payer: Medicaid Other | Source: Ambulatory Visit | Attending: Nurse Practitioner | Admitting: Nurse Practitioner

## 2019-08-17 VITALS — BP 131/87 | HR 80 | Wt 141.4 lb

## 2019-08-17 DIAGNOSIS — Z8632 Personal history of gestational diabetes: Secondary | ICD-10-CM

## 2019-08-17 DIAGNOSIS — O0932 Supervision of pregnancy with insufficient antenatal care, second trimester: Secondary | ICD-10-CM

## 2019-08-17 DIAGNOSIS — Z3480 Encounter for supervision of other normal pregnancy, unspecified trimester: Secondary | ICD-10-CM | POA: Insufficient documentation

## 2019-08-17 DIAGNOSIS — O0990 Supervision of high risk pregnancy, unspecified, unspecified trimester: Secondary | ICD-10-CM | POA: Insufficient documentation

## 2019-08-17 DIAGNOSIS — Z8759 Personal history of other complications of pregnancy, childbirth and the puerperium: Secondary | ICD-10-CM

## 2019-08-17 DIAGNOSIS — Z3A24 24 weeks gestation of pregnancy: Secondary | ICD-10-CM

## 2019-08-17 DIAGNOSIS — Z789 Other specified health status: Secondary | ICD-10-CM

## 2019-08-17 DIAGNOSIS — O09299 Supervision of pregnancy with other poor reproductive or obstetric history, unspecified trimester: Secondary | ICD-10-CM | POA: Insufficient documentation

## 2019-08-17 HISTORY — DX: Personal history of other complications of pregnancy, childbirth and the puerperium: Z87.59

## 2019-08-17 HISTORY — DX: Personal history of gestational diabetes: Z86.32

## 2019-08-17 LAB — GLUCOSE, CAPILLARY: Glucose-Capillary: 101 mg/dL — ABNORMAL HIGH (ref 70–99)

## 2019-08-17 NOTE — Progress Notes (Signed)
Decl[facility] Genetics screening States LMP was 03/01/2019 Has not seen a doctor for this pregnancy. Last Pap two years ago on [address] states results were normal and would like one after her baby is born.   GDM in Previous Pregnancy

## 2019-08-17 NOTE — Progress Notes (Signed)
Subjective:   Courtney Clark is a 29 y.o. G4P3003 at [redacted]w[redacted]d by LMP being seen today for her first obstetrical visit.  Her obstetrical history is significant for late to prenatal care at 24 weeks.  Is not having any problems.  Has no questions today.  . Patient does intend to breast feed. Pregnancy history fully reviewed.  Patient reports no complaints.  HISTORY: OB History  Gravida Para Term Preterm AB Living  4 3 3  0 0 3  SAB TAB Ectopic Multiple Live Births  0 0 0 0 3    # Outcome Date GA Lbr Len/2nd Weight Sex Delivery Anes PTL Lv  4 Current           3 Term 2019    F Vag-Spont None  LIV  2 Term 05/21/12 [redacted]w[redacted]d 04:58 / 00:15 6 lb 15 oz (3.147 kg) M Vag-Spont Other  LIV     Name: AUDRIONNA, LAMPTON     Apgar1: 9  Apgar5: 9  1 Term 02/2010 [redacted]w[redacted]d  7 lb (3.175 kg) M Vag-Spont None  LIV     Birth Comments: NO COMP     Name: Bandera   Past Medical History:  Diagnosis Date  . Anemia    PP  . Language barrier 05/21/2012  . NSVD (normal spontaneous vaginal delivery) 05/21/2012   Past Surgical History:  Procedure Laterality Date  . NO PAST SURGERIES     Family History  Problem Relation Age of Onset  . Heart disease Mother    Social History   Tobacco Use  . Smoking status: Never Smoker  . Smokeless tobacco: Never Used  Substance Use Topics  . Alcohol use: No  . Drug use: No   No Known Allergies Current Outpatient Medications on File Prior to Visit  Medication Sig Dispense Refill  . Prenatal Vit-Fe Fumarate-FA (PRENATAL MULTIVITAMIN) TABS Take 1 tablet by mouth daily.    Marland Kitchen ibuprofen (ADVIL,MOTRIN) 600 MG tablet Take 1 tablet (600 mg total) by mouth every 6 (six) hours as needed for mild pain, moderate pain or cramping. (Patient not taking: Reported on 08/17/2019) 30 tablet 0   No current facility-administered medications on file prior to visit.     Exam   Vitals:   08/17/19 1356  BP: 131/87  Pulse: 80  Weight: 141 lb 6 oz (64.1 kg)   Fetal Heart Rate (bpm): 159   Uterus:  Fundal Height: 26 cm  Pelvic Exam: Perineum:  deferred   Vulva:    Vagina:     Cervix:    Adnexa:    Bony Pelvis:   System: General: well-developed, well-nourished female in no acute distress   Breast:  normal appearance, no masses or tenderness   Skin: normal coloration and turgor, no rashes   Neurologic: oriented, normal, negative, normal mood   Extremities: normal strength, tone, and muscle mass, ROM of all joints is normal   HEENT extraocular movement intact and sclera clear, anicteric   Mouth/Teeth deferred   Neck supple and no masses, normal thyroid   Cardiovascular: regular rate and rhythm   Respiratory:  no respiratory distress, normal breath sounds   Abdomen: soft, non-tender; no masses,  no organomegaly     Assessment:   Pregnancy: [id number] Patient Active Problem List   Diagnosis Date Noted  . History of postpartum hemorrhage, currently pregnant 08/17/2019  . Supervision of other normal pregnancy, antepartum 08/17/2019  . History of diet controlled gestational diabetes mellitus (GDM) 08/17/2019  . Language barrier 05/21/2012  Plan:  1. Supervision of other normal pregnancy, antepartum Doing well.  No questions. Other child is now 52 years old. Will return in 4 weeks for glucola Will need to watch BP  - Culture, OB Urine - GC/Chlamydia probe amp ([place])not at [facility] - Hemoglobinopathy Evaluation - Obstetric Panel, Including HIV - [place] MFM OB COMP + 14 WK - Hemoglobin A1c - Hepatitis C Antibody  2. History of postpartum hemorrhage, currently pregnant  - Culture, OB Urine - GC/Chlamydia probe amp ([place])not at [facility] - Hemoglobinopathy Evaluation - Obstetric Panel, Including HIV - [place] MFM OB COMP + 14 WK - Hemoglobin A1c - Hepatitis C Antibody  3. History of diet controlled gestational diabetes mellitus (GDM)  - Glucose Tolerance, 2 Hours w/1 Hour; Future - Glucose, capillary today - 101  4.  Language Barrier In person interpreter  accompanies her  Initial labs drawn. Continue prenatal vitamins. Genetic Screening discussed, NIPS: declined. Ultrasound discussed; fetal anatomic survey: ordered. Problem list reviewed and updated. The nature of [facility] with multiple MDs and other Advanced Practice Providers was explained to patient; also emphasized that residents, students are part of our team. Routine obstetric precautions reviewed. Return in about 4 weeks (around 09/14/2019) for early AM appt for fasting glucola and ROB.  Total face-to-face time with patient: 40 minutes.  Over 50% of encounter was spent on counseling and coordination of care.     Nolene Bernheim, FNP Family Nurse Practitioner, [facility] for [employer], [facility] 08/17/2019 2:36 PM

## 2019-08-18 LAB — GC/CHLAMYDIA PROBE AMP (~~LOC~~) NOT AT ARMC
Chlamydia: NEGATIVE
Comment: NEGATIVE
Comment: NORMAL
Neisseria Gonorrhea: NEGATIVE

## 2019-08-19 LAB — CULTURE, OB URINE

## 2019-08-19 LAB — URINE CULTURE, OB REFLEX

## 2019-08-20 ENCOUNTER — Encounter: Payer: Self-pay | Admitting: Nurse Practitioner

## 2019-08-20 DIAGNOSIS — O285 Abnormal chromosomal and genetic finding on antenatal screening of mother: Secondary | ICD-10-CM | POA: Insufficient documentation

## 2019-08-20 DIAGNOSIS — O99012 Anemia complicating pregnancy, second trimester: Secondary | ICD-10-CM | POA: Insufficient documentation

## 2019-08-20 HISTORY — DX: Abnormal chromosomal and genetic finding on antenatal screening of mother: O28.5

## 2019-08-20 LAB — HGB FRACTIONATION BY HPLC
Hgb A: 74.6 % — ABNORMAL LOW (ref 96.4–98.8)
Hgb C: 0 %
Hgb E: 21.1 % — ABNORMAL HIGH
Hgb F: 0.8 % (ref 0.0–2.0)
Hgb S: 0 %
Hgb Variant: 0 %

## 2019-08-20 LAB — OBSTETRIC PANEL, INCLUDING HIV
Antibody Screen: NEGATIVE
Basophils Absolute: 0 10*3/uL (ref 0.0–0.2)
Basos: 0 %
EOS (ABSOLUTE): 0.1 10*3/uL (ref 0.0–0.4)
Eos: 1 %
HIV Screen 4th Generation wRfx: NONREACTIVE
Hematocrit: 31.2 % — ABNORMAL LOW (ref 34.0–46.6)
Hemoglobin: 9.9 g/dL — ABNORMAL LOW (ref 11.1–15.9)
Hepatitis B Surface Ag: NEGATIVE
Immature Grans (Abs): 0 10*3/uL (ref 0.0–0.1)
Immature Granulocytes: 1 %
Lymphocytes Absolute: 1.4 10*3/uL (ref 0.7–3.1)
Lymphs: 19 %
MCH: 21.9 pg — ABNORMAL LOW (ref 26.6–33.0)
MCHC: 31.7 g/dL (ref 31.5–35.7)
MCV: 69 fL — ABNORMAL LOW (ref 79–97)
Monocytes Absolute: 0.6 10*3/uL (ref 0.1–0.9)
Monocytes: 8 %
Neutrophils Absolute: 5.3 10*3/uL (ref 1.4–7.0)
Neutrophils: 71 %
Platelets: 323 10*3/uL (ref 150–450)
RBC: 4.52 x10E6/uL (ref 3.77–5.28)
RDW: 14.3 % (ref 11.7–15.4)
RPR Ser Ql: NONREACTIVE
Rh Factor: POSITIVE
Rubella Antibodies, IGG: 4.43 index (ref >0.99)
WBC: 7.4 10*3/uL (ref 3.4–10.8)

## 2019-08-20 LAB — HGB FRACTIONATION CASCADE: Hgb A2: 3.5 % — ABNORMAL HIGH (ref 1.8–3.2)

## 2019-08-20 LAB — HEMOGLOBIN A1C
Est. average glucose Bld gHb Est-mCnc: 105 mg/dL
Hgb A1c MFr Bld: 5.3 % (ref 4.8–5.6)

## 2019-08-20 LAB — HEPATITIS C ANTIBODY: Hep C Virus Ab: <0.1 s/co ratio (ref 0.0–0.9)

## 2019-09-07 ENCOUNTER — Ambulatory Visit: Payer: Medicaid Other | Attending: Obstetrics and Gynecology

## 2019-09-07 ENCOUNTER — Other Ambulatory Visit: Payer: Self-pay

## 2019-09-07 ENCOUNTER — Other Ambulatory Visit: Payer: Self-pay | Admitting: *Deleted

## 2019-09-07 DIAGNOSIS — Z3480 Encounter for supervision of other normal pregnancy, unspecified trimester: Secondary | ICD-10-CM | POA: Diagnosis not present

## 2019-09-07 DIAGNOSIS — Z3A27 27 weeks gestation of pregnancy: Secondary | ICD-10-CM

## 2019-09-07 DIAGNOSIS — O09292 Supervision of pregnancy with other poor reproductive or obstetric history, second trimester: Secondary | ICD-10-CM

## 2019-09-07 DIAGNOSIS — O0932 Supervision of pregnancy with insufficient antenatal care, second trimester: Secondary | ICD-10-CM

## 2019-09-07 DIAGNOSIS — O0930 Supervision of pregnancy with insufficient antenatal care, unspecified trimester: Secondary | ICD-10-CM

## 2019-09-07 DIAGNOSIS — O09299 Supervision of pregnancy with other poor reproductive or obstetric history, unspecified trimester: Secondary | ICD-10-CM | POA: Insufficient documentation

## 2019-09-07 DIAGNOSIS — Z363 Encounter for antenatal screening for malformations: Secondary | ICD-10-CM

## 2019-09-13 ENCOUNTER — Encounter: Payer: Self-pay | Admitting: Nurse Practitioner

## 2019-09-13 ENCOUNTER — Other Ambulatory Visit: Payer: Self-pay

## 2019-09-13 ENCOUNTER — Ambulatory Visit (INDEPENDENT_AMBULATORY_CARE_PROVIDER_SITE_OTHER): Payer: Self-pay | Admitting: Nurse Practitioner

## 2019-09-13 ENCOUNTER — Other Ambulatory Visit: Payer: Self-pay | Admitting: *Deleted

## 2019-09-13 VITALS — BP 105/70 | HR 94 | Wt 136.9 lb

## 2019-09-13 DIAGNOSIS — O99013 Anemia complicating pregnancy, third trimester: Secondary | ICD-10-CM

## 2019-09-13 DIAGNOSIS — Z3480 Encounter for supervision of other normal pregnancy, unspecified trimester: Secondary | ICD-10-CM

## 2019-09-13 DIAGNOSIS — Z3A28 28 weeks gestation of pregnancy: Secondary | ICD-10-CM

## 2019-09-13 DIAGNOSIS — D649 Anemia, unspecified: Secondary | ICD-10-CM

## 2019-09-13 DIAGNOSIS — O99012 Anemia complicating pregnancy, second trimester: Secondary | ICD-10-CM

## 2019-09-13 DIAGNOSIS — Z23 Encounter for immunization: Secondary | ICD-10-CM

## 2019-09-13 DIAGNOSIS — Z8759 Personal history of other complications of pregnancy, childbirth and the puerperium: Secondary | ICD-10-CM

## 2019-09-13 DIAGNOSIS — Z789 Other specified health status: Secondary | ICD-10-CM

## 2019-09-13 NOTE — Progress Notes (Signed)
Here for ob fu. Did not bring bp's from home. Will review with provider. Instructed patient to write down date/time and bp each time she checks her bp at home once a week; and bring with her to next ob visit. She voices understanding. Weiland Tomich,RN

## 2019-09-13 NOTE — Progress Notes (Signed)
    Subjective:  Courtney Clark is a 29 y.o. G4P3003 at [redacted]w[redacted]d being seen today for ongoing prenatal care.  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  Patient reports no complaints.  Contractions: Not present. Vag. Bleeding: None.  Movement: Present. Denies leaking of fluid.   The following portions of the patient's history were reviewed and updated as appropriate: allergies, current medications, past family history, past medical history, past social history, past surgical history and problem list. Problem list updated.  Objective:   Vitals:   09/13/19 0848  BP: 105/70  Pulse: 94  Weight: 136 lb 14.4 oz (62.1 kg)    Fetal Status: Fetal Heart Rate (bpm): 139 Fundal Height: 29 cm Movement: Present     General:  Alert, oriented and cooperative. Patient is in no acute distress.  Skin: Skin is warm and dry. No rash noted.   Cardiovascular: Normal heart rate noted  Respiratory: Normal respiratory effort, no problems with respiration noted  Abdomen: Soft, gravid, appropriate for gestational age. Pain/Pressure: Absent     Pelvic:  Cervical exam deferred        Extremities: Normal range of motion.  Edema: None  Mental Status: Normal mood and affect. Normal behavior. Normal judgment and thought content.   Urinalysis:      Assessment and Plan:  Pregnancy: G4P3003 at [redacted]w[redacted]d  1. Supervision of other normal pregnancy, antepartum 2 hr glucola and CBC done today Declines info on contraception - plans to use condoms - is not sure if she wants more children  - Tdap vaccine greater than or equal to 7yo IM  2. Language barrier In person interpreter present  for the entire visit  3. Anemia in pregnancy, second trimester Checking CBC today.  Not taking iron  Preterm labor symptoms and general obstetric precautions including but not limited to vaginal bleeding, contractions, leaking of fluid and fetal movement were reviewed in detail with the patient. Please refer to After Visit Summary for other counseling recommendations.  Return in about 2 weeks (around 09/27/2019) for in person ROB.  Nolene Bernheim, RN, MSN, NP-BC Nurse Practitioner, [facility] for [employer], [facility] 09/13/2019 9:29 AM

## 2019-09-14 LAB — GLUCOSE TOLERANCE, 2 HOURS W/ 1HR
Glucose, 1 hour: 197 mg/dL — ABNORMAL HIGH (ref 65–179)
Glucose, 2 hour: 146 mg/dL (ref 65–152)
Glucose, Fasting: 88 mg/dL (ref 65–91)

## 2019-09-14 LAB — CBC
Hematocrit: 34 % (ref 34.0–46.6)
Hemoglobin: 10.3 g/dL — ABNORMAL LOW (ref 11.1–15.9)
MCH: 20.9 pg — ABNORMAL LOW (ref 26.6–33.0)
MCHC: 30.3 g/dL — ABNORMAL LOW (ref 31.5–35.7)
MCV: 69 fL — ABNORMAL LOW (ref 79–97)
NRBC: 2 % — ABNORMAL HIGH (ref 0–0)
Platelets: 313 10*3/uL (ref 150–450)
RBC: 4.94 x10E6/uL (ref 3.77–5.28)
RDW: 15.4 % (ref 11.7–15.4)
WBC: 7.3 10*3/uL (ref 3.4–10.8)

## 2019-09-14 LAB — RPR: RPR Ser Ql: NONREACTIVE

## 2019-09-19 ENCOUNTER — Encounter (HOSPITAL_COMMUNITY): Payer: Self-pay

## 2019-09-19 DIAGNOSIS — O24410 Gestational diabetes mellitus in pregnancy, diet controlled: Secondary | ICD-10-CM | POA: Insufficient documentation

## 2019-09-20 ENCOUNTER — Telehealth: Payer: Self-pay

## 2019-09-20 DIAGNOSIS — O99013 Anemia complicating pregnancy, third trimester: Secondary | ICD-10-CM

## 2019-09-20 DIAGNOSIS — O24410 Gestational diabetes mellitus in pregnancy, diet controlled: Secondary | ICD-10-CM

## 2019-09-20 MED ORDER — FERROUS SULFATE 325 (65 FE) MG PO TABS: 325.0000 mg | ORAL_TABLET | Freq: Every day | ORAL | 0 refills | Status: DC

## 2019-09-20 MED ORDER — ACCU-CHEK GUIDE VI STRP
ORAL_STRIP | 12 refills | Status: DC
Start: 2019-09-20 — End: 2019-12-01

## 2019-09-20 MED ORDER — ACCU-CHEK SOFTCLIX LANCETS MISC: 12 refills | Status: DC

## 2019-09-20 MED ORDER — ACCU-CHEK GUIDE W/DEVICE KIT: 1.0000 | PACK | Freq: Once | 0 refills | Status: AC

## 2019-09-20 NOTE — Telephone Encounter (Addendum)
-----   Message from Gerrit Heck, [place] sent at 09/19/2019  4:26 AM EDT ----- Please call patient and inform of results.  Instruct to start taking iron pill and set up for diabetic education.    Called pt with Language Resources interpreter Sno. VM left stating pt has new appt in our office for 09/23/19 at 0915 and that pt should pick up prescriptions from the pharmacy prior to appt.

## 2019-09-23 ENCOUNTER — Other Ambulatory Visit: Payer: Self-pay

## 2019-10-05 ENCOUNTER — Ambulatory Visit: Payer: Medicaid Other | Attending: Obstetrics and Gynecology

## 2019-10-05 ENCOUNTER — Ambulatory Visit (INDEPENDENT_AMBULATORY_CARE_PROVIDER_SITE_OTHER): Payer: Self-pay | Admitting: Medical

## 2019-10-05 ENCOUNTER — Other Ambulatory Visit: Payer: Self-pay

## 2019-10-05 VITALS — BP 107/68 | HR 88 | Wt 137.0 lb

## 2019-10-05 DIAGNOSIS — Z3A31 31 weeks gestation of pregnancy: Secondary | ICD-10-CM

## 2019-10-05 DIAGNOSIS — O0993 Supervision of high risk pregnancy, unspecified, third trimester: Secondary | ICD-10-CM

## 2019-10-05 DIAGNOSIS — O285 Abnormal chromosomal and genetic finding on antenatal screening of mother: Secondary | ICD-10-CM

## 2019-10-05 DIAGNOSIS — O09293 Supervision of pregnancy with other poor reproductive or obstetric history, third trimester: Secondary | ICD-10-CM

## 2019-10-05 DIAGNOSIS — O0930 Supervision of pregnancy with insufficient antenatal care, unspecified trimester: Secondary | ICD-10-CM | POA: Diagnosis not present

## 2019-10-05 DIAGNOSIS — Z8759 Personal history of other complications of pregnancy, childbirth and the puerperium: Secondary | ICD-10-CM

## 2019-10-05 DIAGNOSIS — O0933 Supervision of pregnancy with insufficient antenatal care, third trimester: Secondary | ICD-10-CM

## 2019-10-05 DIAGNOSIS — O24410 Gestational diabetes mellitus in pregnancy, diet controlled: Secondary | ICD-10-CM

## 2019-10-05 DIAGNOSIS — O0990 Supervision of high risk pregnancy, unspecified, unspecified trimester: Secondary | ICD-10-CM

## 2019-10-05 DIAGNOSIS — Z362 Encounter for other antenatal screening follow-up: Secondary | ICD-10-CM

## 2019-10-05 DIAGNOSIS — Z789 Other specified health status: Secondary | ICD-10-CM

## 2019-10-05 DIAGNOSIS — Z603 Acculturation difficulty: Secondary | ICD-10-CM

## 2019-10-05 DIAGNOSIS — D649 Anemia, unspecified: Secondary | ICD-10-CM

## 2019-10-05 DIAGNOSIS — O99012 Anemia complicating pregnancy, second trimester: Secondary | ICD-10-CM

## 2019-10-05 NOTE — Progress Notes (Signed)
° °  PRENATAL VISIT NOTE  Subjective:  Courtney Clark is a 29 y.o. G4P3003 at [redacted]w[redacted]d being seen today for ongoing prenatal care.  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  Patient reports no complaints.  Contractions: Not present. Vag. Bleeding: None.  Movement: Present. Denies leaking of fluid.   The following portions of the patient's history were reviewed and updated as appropriate: allergies, current medications, past family history, past medical history, past social history, past surgical history and problem list.   Objective:   Vitals:   10/05/19 0840  BP: 107/68  Pulse: 88  Weight: 137 lb (62.1 kg)    Fetal Status: Fetal Heart Rate (bpm): 142 Fundal Height: 31 cm Movement: Present     General:  Alert, oriented and cooperative. Patient is in no acute distress.  Skin: Skin is warm and dry. No rash noted.   Cardiovascular: Normal heart rate noted  Respiratory: Normal respiratory effort, no problems with respiration noted  Abdomen: Soft, gravid, appropriate for gestational age.  Pain/Pressure: Absent     Pelvic: Cervical exam deferred        Extremities: Normal range of motion.  Edema: None  Mental Status: Normal mood and affect. Normal behavior. Normal judgment and thought content.   Assessment and Plan:  Pregnancy: G4P3003 at [redacted]w[redacted]d 1. Diet controlled gestational diabetes mellitus (GDM) in third trimester - Reviewed sugar log, all normal values - Advised to schedule with Diabetes education before leaving today  - Will need growth [place] at 36 weeks  - Delivery 39-40 weeks  2.  Supervision of high risk pregnancy, antepartum - [place] follow-up to complete anatomy later today   3. 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 Anemia in pregnancy, second trimester - taking iron supplement   5. Language barrier - interpreter present  Preterm labor symptoms and general obstetric precautions including but not limited to vaginal bleeding, contractions, leaking of fluid and fetal movement were reviewed in detail with the patient. Please refer to After Visit Summary for other counseling recommendations.   Return in about 2 weeks (around 10/19/2019) for [facility] APP, In-Person.  Future Appointments  Date Time Provider Department Center  10/05/2019  2:00 PM [facility]  10/12/2019  8:15 AM [facility]  10/26/2019 10:35 AM Warden Fillers, MD [facility]  11/09/2019  9:35 AM Constant, Gigi Gin, MD [facility]  11/16/2019  8:15 AM Federico Flake, MD [facility]  11/23/2019  8:15 AM Conan Bowens, MD [facility]  11/30/2019  8:15 AM Adam Phenix, MD [facility]  12/07/2019  8:15 AM Conan Bowens, MD [facility]  12/14/2019  8:15 AM Adam Phenix, MD [facility]    Vonzella Nipple, PA-C

## 2019-10-05 NOTE — Patient Instructions (Addendum)
Fetal Movement Counts Patient Name: ________________________________________________ Patient Due Date: ____________________ What is a fetal movement count?  A fetal movement count is the number of times that you feel your baby move during a certain amount of time. This may also be called a fetal kick count. A fetal movement count is recommended for every pregnant woman. You may be asked to start counting fetal movements as early as week 28 of your pregnancy. Pay attention to when your baby is most active. You may notice your baby's sleep and wake cycles. You may also notice things that make your baby move more. You should do a fetal movement count:  When your baby is normally most active.  At the same time each day. A good time to count movements is while you are resting, after having something to eat and drink. How do I count fetal movements? 1. Find a quiet, comfortable area. Sit, or lie down on your side. 2. Write down the date, the start time and stop time, and the number of movements that you felt between those two times. Take this information with you to your health care visits. 3. Write down your start time when you feel the first movement. 4. Count kicks, flutters, swishes, rolls, and jabs. You should feel at least 10 movements. 5. You may stop counting after you have felt 10 movements, or if you have been counting for 2 hours. Write down the stop time. 6. If you do not feel 10 movements in 2 hours, contact your health care provider for further instructions. Your health care provider may want to do additional tests to assess your baby's well-being. Contact a health care provider if:  You feel fewer than 10 movements in 2 hours.  Your baby is not moving like he or she usually does. Date: ____________ Start time: ____________ Stop time: ____________ Movements: ____________ Date: ____________ Start time: ____________ Stop time: ____________ Movements: ____________ Date: ____________  Start time: ____________ Stop time: ____________ Movements: ____________ Date: ____________ Start time: ____________ Stop time: ____________ Movements: ____________ Date: ____________ Start time: ____________ Stop time: ____________ Movements: ____________ Date: ____________ Start time: ____________ Stop time: ____________ Movements: ____________ Date: ____________ Start time: ____________ Stop time: ____________ Movements: ____________ Date: ____________ Start time: ____________ Stop time: ____________ Movements: ____________ Date: ____________ Start time: ____________ Stop time: ____________ Movements: ____________ This information is not intended to replace advice given to you by your health care provider. Make sure you discuss any questions you have with your health care provider. Document Revised: 10/29/2018 Document Reviewed: 10/29/2018 Elsevier Patient Education  2020 [employer] Braxton Hicks Contractions Contractions of the uterus can occur throughout pregnancy, but they are not always a sign that you are in labor. You may have practice contractions called Braxton Hicks contractions. These false labor contractions are sometimes confused with true labor. What are Braxton Hicks contractions? Braxton Hicks contractions are tightening movements that occur in the muscles of the uterus before labor. Unlike true labor contractions, these contractions do not result in opening (dilation) and thinning of the cervix. Toward the end of pregnancy (32-34 weeks), Braxton Hicks contractions can happen more often and may become stronger. These contractions are sometimes difficult to tell apart from true labor because they can be very uncomfortable. You should not feel embarrassed if you go to the hospital with false labor. Sometimes, the only way to tell if you are in true labor is for your health care provider to look for changes in the cervix. The health care provider   will do a physical exam and may  monitor your contractions. If you are not in true labor, the exam should show that your cervix is not dilating and your water has not broken. If there are no other health problems associated with your pregnancy, it is completely safe for you to be sent home with false labor. You may continue to have Braxton Hicks contractions until you go into true labor. How to tell the difference between true labor and false labor True labor  Contractions last 30-70 seconds.  Contractions become very regular.  Discomfort is usually felt in the top of the uterus, and it spreads to the lower abdomen and low back.  Contractions do not go away with walking.  Contractions usually become more intense and increase in frequency.  The cervix dilates and gets thinner. False labor  Contractions are usually shorter and not as strong as true labor contractions.  Contractions are usually irregular.  Contractions are often felt in the front of the lower abdomen and in the groin.  Contractions may go away when you walk around or change positions while lying down.  Contractions get weaker and are shorter-lasting as time goes on.  The cervix usually does not dilate or become thin. Follow these instructions at home:   Take over-the-counter and prescription medicines only as told by your health care provider.  Keep up with your usual exercises and follow other instructions from your health care provider.  Eat and drink lightly if you think you are going into labor.  If Braxton Hicks contractions are making you uncomfortable: ? Change your position from lying down or resting to walking, or change from walking to resting. ? Sit and rest in a tub of warm water. ? Drink enough fluid to keep your urine pale yellow. Dehydration may cause these contractions. ? Do slow and deep breathing several times an hour.  Keep all follow-up prenatal visits as told by your health care provider. This is important. Contact a  health care provider if:  You have a fever.  You have continuous pain in your abdomen. Get help right away if:  Your contractions become stronger, more regular, and closer together.  You have fluid leaking or gushing from your vagina.  You pass blood-tinged mucus (bloody show).  You have bleeding from your vagina.  You have low back pain that you never had before.  You feel your baby's head pushing down and causing pelvic pressure.  Your baby is not moving inside you as much as it used to. Summary  Contractions that occur before labor are called Braxton Hicks contractions, false labor, or practice contractions.  Braxton Hicks contractions are usually shorter, weaker, farther apart, and less regular than true labor contractions. True labor contractions usually become progressively stronger and regular, and they become more frequent.  Manage discomfort from Braxton Hicks contractions by changing position, resting in a warm bath, drinking plenty of water, or practicing deep breathing. This information is not intended to replace advice given to you by your health care provider. Make sure you discuss any questions you have with your health care provider. Document Revised: 02/21/2017 Document Reviewed: 07/25/2016 Elsevier Patient Education  2020 Elsevier Inc.  

## 2019-10-12 ENCOUNTER — Other Ambulatory Visit: Payer: Self-pay | Admitting: Nurse Practitioner

## 2019-10-12 ENCOUNTER — Encounter: Payer: Medicaid Other | Admitting: Registered"

## 2019-10-12 ENCOUNTER — Other Ambulatory Visit: Payer: Self-pay

## 2019-10-12 ENCOUNTER — Ambulatory Visit: Payer: Self-pay | Admitting: Registered"

## 2019-10-12 DIAGNOSIS — O24410 Gestational diabetes mellitus in pregnancy, diet controlled: Secondary | ICD-10-CM | POA: Diagnosis not present

## 2019-10-12 DIAGNOSIS — Z3A00 Weeks of gestation of pregnancy not specified: Secondary | ICD-10-CM | POA: Diagnosis not present

## 2019-10-12 DIAGNOSIS — Z8632 Personal history of gestational diabetes: Secondary | ICD-10-CM

## 2019-10-12 NOTE — Progress Notes (Signed)
Interpreter services provided by [facility] from [facility]  Patient was seen on 7/20 for Gestational Diabetes self-management. EDD 12/06/19. Patient states prior history of GDM. Diet history obtained. Patient eats variety of all food groups. Beverages include water.  Patient is likely consuming excess carbohydrates in the form of rice.   The following learning objectives were met by the patient :   States the definition of Gestational Diabetes  States why dietary management is important in controlling blood glucose  Describes the effects of carbohydrates on blood glucose levels  Demonstrates ability to create a balanced meal plan  Demonstrates carbohydrate counting   States when to check blood glucose levels  Demonstrates proper blood glucose monitoring techniques  States the effect of stress and exercise on blood glucose levels  States the importance of limiting caffeine and abstaining from alcohol and smoking  Plan:  Aim for 3 Carbohydrate Choices per meal (45 grams) +/- 1 either way  Aim for 1-2 Carbohydrate Choices per snack Begin reading food labels for Total Carbohydrate of foods If OK with your MD, consider  increasing your activity level by walking, Arm Chair Exercises or other activity daily as tolerated Begin checking Blood Glucose before breakfast and 2 hours after first bite of breakfast, lunch and dinner as directed by MD  Bring Log Book/Sheet and meter to every medical appointment  Baby Scripts: Patient not appropriate for Baby Scripts due to  language barrier  Take medication if directed by MD  Patient already has a meter and is testing pre breakfast and 2 hours after each meal. Pt did not bring Log Book, but reports: FBS: 1-2x/week 100, 103 mg/dL PPBG: 2-3x/week over 120 mg/dL  Pt states she notices when she eats white rice instead of brown rice it makes a difference.   Patient instructed to monitor glucose levels: FBS: 60 - 95 mg/dl 2 hour: <120  mg/dl  Patient received the following handouts: none  Patient will be seen for follow-up in as needed.

## 2019-10-18 ENCOUNTER — Other Ambulatory Visit: Payer: Self-pay

## 2019-10-26 ENCOUNTER — Other Ambulatory Visit: Payer: Self-pay

## 2019-10-26 ENCOUNTER — Ambulatory Visit (INDEPENDENT_AMBULATORY_CARE_PROVIDER_SITE_OTHER): Payer: Medicaid Other | Admitting: Obstetrics and Gynecology

## 2019-10-26 VITALS — BP 115/79 | HR 86 | Wt 138.4 lb

## 2019-10-26 DIAGNOSIS — O09299 Supervision of pregnancy with other poor reproductive or obstetric history, unspecified trimester: Secondary | ICD-10-CM

## 2019-10-26 DIAGNOSIS — O0990 Supervision of high risk pregnancy, unspecified, unspecified trimester: Secondary | ICD-10-CM

## 2019-10-26 DIAGNOSIS — Z789 Other specified health status: Secondary | ICD-10-CM

## 2019-10-26 DIAGNOSIS — O285 Abnormal chromosomal and genetic finding on antenatal screening of mother: Secondary | ICD-10-CM

## 2019-10-26 DIAGNOSIS — O24410 Gestational diabetes mellitus in pregnancy, diet controlled: Secondary | ICD-10-CM

## 2019-10-26 NOTE — Progress Notes (Signed)
° °  PRENATAL VISIT NOTE  Subjective:  Courtney Clark is a 29 y.o. G4P3003 at [redacted]w[redacted]d being seen today for ongoing prenatal care.  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  Patient doing well with no acute concerns today. She reports no complaints.  Contractions: Not present. Vag. Bleeding: None.  Movement: Present. Denies leaking of fluid.   The following portions of the patient's history were reviewed and updated as appropriate: allergies, current medications, past family history, past medical history, past social history, past surgical history and problem list. Problem list updated.  Objective:   Vitals:   10/26/19 1112  BP: 115/79  Pulse: 86  Weight: 138 lb 6.4 oz (62.8 kg)    Fetal Status: Fetal Heart Rate (bpm): 154 Fundal Height: 34 cm Movement: Present     General:  Alert, oriented and cooperative. Patient is in no acute distress.  Skin: Skin is warm and dry. No rash noted.   Cardiovascular: Normal heart rate noted  Respiratory: Normal respiratory effort, no problems with respiration noted  Abdomen: Soft, gravid, appropriate for gestational age.  Pain/Pressure: Absent     Pelvic: Cervical exam deferred        Extremities: Normal range of motion.  Edema: None  Mental Status:  Normal mood and affect. Normal behavior. Normal judgment and thought content.   Assessment and Plan:  Pregnancy: G4P3003 at [redacted]w[redacted]d  1. Diet controlled gestational diabetes mellitus (GDM) in third trimester Good blood sugar control, FBS 81-94                                            PPBS: 83-120 Growth scan  ordered for 36 weeks - [place] MFM OB FOLLOW UP; Future  2. Language barrier Interpreter present  3. History of postpartum hemorrhage, currently pregnant TXA at time of delivery  4. Supervision of high risk pregnancy, antepartum 36 week labs at next visit  5. 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  Please refer to After Visit Summary for other counseling recommendations.   Return in about 2 weeks (around 11/09/2019) for [facility], in person, 36 weeks swabs.   Mariel Aloe, MD

## 2019-10-26 NOTE — Progress Notes (Signed)
Pt has Paper logs for Glucose.

## 2019-11-09 ENCOUNTER — Other Ambulatory Visit (HOSPITAL_COMMUNITY)
Admission: RE | Admit: 2019-11-09 | Discharge: 2019-11-09 | Disposition: A | Discharge status: Home / Self Care | Payer: Medicaid Other | Source: Ambulatory Visit | Attending: Obstetrics and Gynecology | Admitting: Obstetrics and Gynecology

## 2019-11-09 ENCOUNTER — Other Ambulatory Visit: Payer: Self-pay

## 2019-11-09 ENCOUNTER — Encounter: Payer: Self-pay | Admitting: Obstetrics and Gynecology

## 2019-11-09 ENCOUNTER — Ambulatory Visit (INDEPENDENT_AMBULATORY_CARE_PROVIDER_SITE_OTHER): Payer: Medicaid Other | Admitting: Obstetrics and Gynecology

## 2019-11-09 VITALS — BP 108/80 | HR 93 | Wt 139.5 lb

## 2019-11-09 DIAGNOSIS — O0990 Supervision of high risk pregnancy, unspecified, unspecified trimester: Secondary | ICD-10-CM | POA: Diagnosis not present

## 2019-11-09 DIAGNOSIS — O24410 Gestational diabetes mellitus in pregnancy, diet controlled: Secondary | ICD-10-CM

## 2019-11-09 DIAGNOSIS — Z789 Other specified health status: Secondary | ICD-10-CM

## 2019-11-09 LAB — POCT URINALYSIS DIP (DEVICE)
Bilirubin Urine: NEGATIVE
Glucose, UA: NEGATIVE mg/dL
Hgb urine dipstick: NEGATIVE
Ketones, ur: NEGATIVE mg/dL
Nitrite: NEGATIVE
Protein, ur: NEGATIVE mg/dL
Specific Gravity, Urine: 1.02 (ref 1.005–1.030)
Urobilinogen, UA: 0.2 mg/dL (ref 0.0–1.0)
pH: 6.5 (ref 5.0–8.0)

## 2019-11-09 NOTE — Progress Notes (Signed)
   PRENATAL VISIT NOTE  Subjective:  Courtney Clark is a 29 y.o. G4P3003 at [redacted]w[redacted]d being seen today for ongoing prenatal care.  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  Patient reports no complaints.  Contractions: Not present. Vag. Bleeding: None.  Movement: Present. Denies leaking of fluid.   The following portions of the patient's history were reviewed and updated as appropriate: allergies, current medications, past family history, past medical history, past social history, past surgical history and problem list.   Objective:   Vitals:   11/09/19 0954  BP: 108/80  Pulse: 93  Weight: 139 lb 8 oz (63.3 kg)    Fetal Status: Fetal Heart Rate (bpm): 141 Fundal Height: 35 cm Movement: Present  Presentation: Vertex  General:  Alert, oriented and cooperative. Patient is in no acute distress.  Skin: Skin is warm and dry. No rash noted.   Cardiovascular: Normal heart rate noted  Respiratory: Normal respiratory effort, no problems with respiration noted  Abdomen: Soft, gravid, appropriate for gestational age.  Pain/Pressure: Absent     Pelvic: Cervical exam performed in the presence of a chaperone Dilation: Fingertip Effacement (%): Thick Station: Ballotable  Extremities: Normal range of motion.  Edema: None  Mental Status: Normal mood and affect. Normal behavior. Normal judgment and thought content.   Assessment and Plan:  Pregnancy: G4P3003 at [redacted]w[redacted]d 1. Supervision of high risk pregnancy, antepartum Patient is doing well without complaints Vaginal cultures collected  2. Diet controlled  gestational diabetes mellitus (GDM) in third trimester CBGs reviewed and fasting 70-88 and pp 101-120 Continue diet control Growth ultrasound ordered   3. Language barrier Interpreter present  Preterm labor symptoms and general obstetric precautions including but not limited to vaginal bleeding, contractions, leaking of fluid and fetal movement were reviewed in detail with the patient. Please refer to After Visit Summary for other counseling recommendations.   Return in about 1 week (around 11/16/2019) for in person, ROB, High risk.  Future Appointments  Date Time Provider Department Center  11/16/2019  8:15 AM Federico Flake, MD [facility]  11/23/2019  8:15 AM Conan Bowens, MD [facility]  11/30/2019  8:15 AM Adam Phenix, MD [facility]  12/07/2019  8:15 AM Conan Bowens, MD [facility]  12/14/2019  8:15 AM Adam Phenix, MD [facility]    Catalina Antigua, MD

## 2019-11-10 LAB — CERVICOVAGINAL ANCILLARY ONLY
Chlamydia: NEGATIVE
Comment: NEGATIVE
Comment: NORMAL
Neisseria Gonorrhea: NEGATIVE

## 2019-11-12 ENCOUNTER — Ambulatory Visit: Payer: Medicaid Other | Admitting: *Deleted

## 2019-11-12 ENCOUNTER — Encounter: Payer: Self-pay | Admitting: *Deleted

## 2019-11-12 ENCOUNTER — Ambulatory Visit: Payer: Medicaid Other | Attending: Obstetrics and Gynecology

## 2019-11-12 ENCOUNTER — Other Ambulatory Visit: Payer: Self-pay

## 2019-11-12 DIAGNOSIS — O24410 Gestational diabetes mellitus in pregnancy, diet controlled: Secondary | ICD-10-CM | POA: Diagnosis not present

## 2019-11-12 DIAGNOSIS — O0933 Supervision of pregnancy with insufficient antenatal care, third trimester: Secondary | ICD-10-CM

## 2019-11-12 DIAGNOSIS — Z362 Encounter for other antenatal screening follow-up: Secondary | ICD-10-CM | POA: Diagnosis not present

## 2019-11-12 DIAGNOSIS — O0990 Supervision of high risk pregnancy, unspecified, unspecified trimester: Secondary | ICD-10-CM | POA: Insufficient documentation

## 2019-11-12 DIAGNOSIS — Z3A36 36 weeks gestation of pregnancy: Secondary | ICD-10-CM | POA: Diagnosis not present

## 2019-11-12 DIAGNOSIS — O09293 Supervision of pregnancy with other poor reproductive or obstetric history, third trimester: Secondary | ICD-10-CM | POA: Diagnosis not present

## 2019-11-13 LAB — CULTURE, BETA STREP (GROUP B ONLY): Strep Gp B Culture: NEGATIVE

## 2019-11-15 ENCOUNTER — Encounter: Payer: Self-pay | Admitting: Women's Health

## 2019-11-15 NOTE — Progress Notes (Signed)
Subjective:  Courtney Clark is a 29 y.o. G4P3003 at [redacted]w[redacted]d being seen today for ongoing prenatal care.  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  Patient reports no complaints.  Contractions: Not present. Vag. Bleeding: None.  Movement: Present. Denies leaking of fluid.   The following portions of the patient's history were reviewed and updated as appropriate: allergies, current medications, past family history, past medical history, past social history, past surgical history and problem list. Problem list updated.  Objective:   Vitals:   11/16/19 0832  BP: 121/81  Pulse: 88  Weight: 141 lb (64 kg)    Fetal Status: Fetal Heart Rate (bpm): 128 Fundal Height: 37 cm Movement: Present     General:  Alert, oriented and cooperative. Patient is in no acute distress.  Skin: Skin is warm and dry. No rash noted.   Cardiovascular: Normal heart rate noted  Respiratory: Normal respiratory effort, no problems with respiration noted  Abdomen: Soft, gravid, appropriate for gestational age. Pain/Pressure: Present     Pelvic: Vag. Bleeding: None     Cervical exam deferred        Extremities: Normal range of motion.  Edema: None  Mental Status: Normal mood and affect. Normal behavior. Normal judgment and thought content.   Urinalysis:      Assessment and Plan:  Pregnancy: G4P3003 at [redacted]w[redacted]d  1. Supervision of high risk pregnancy, antepartum - no concerns  2. Diet controlled gestational diabetes mellitus (GDM) in third trimester -CBGs reviewed and fasting 70-86 and pp 80-110 -Continue diet control -Growth ultrasound ordered @36  weeks, EFW 23%, AFI 12.26cm, no  further indicated per MFM  3. 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 Anemia in pregnancy, second trimester -CBC -ferritin  5. Language barrier -interpreter present for entire visit - husband as interpreter as language not able to be found in stratus   Term labor symptoms and general obstetric precautions including but not limited to vaginal bleeding, contractions, leaking of fluid and fetal movement were reviewed in detail with the patient. I discussed the assessment and treatment plan with the patient. The patient was provided an opportunity to ask questions and all were answered. The patient agreed with the plan and demonstrated an understanding of the instructions. The patient was advised to call back or seek an in-person office evaluation/go to [facility] at [facility] for any urgent or concerning symptoms. Please refer to After Visit Summary for other counseling recommendations.  Return in about 1 week (around 11/23/2019) for in-person HOB/APP OK.   Lilyannah Zuelke, 11/25/2019, NP

## 2019-11-16 ENCOUNTER — Other Ambulatory Visit: Payer: Self-pay

## 2019-11-16 ENCOUNTER — Encounter: Payer: Self-pay | Admitting: Women's Health

## 2019-11-16 ENCOUNTER — Ambulatory Visit (INDEPENDENT_AMBULATORY_CARE_PROVIDER_SITE_OTHER): Payer: Medicaid Other | Admitting: Women's Health

## 2019-11-16 ENCOUNTER — Encounter: Payer: Self-pay | Admitting: Family Medicine

## 2019-11-16 VITALS — BP 121/81 | HR 88 | Wt 141.0 lb

## 2019-11-16 DIAGNOSIS — O24410 Gestational diabetes mellitus in pregnancy, diet controlled: Secondary | ICD-10-CM

## 2019-11-16 DIAGNOSIS — O285 Abnormal chromosomal and genetic finding on antenatal screening of mother: Secondary | ICD-10-CM

## 2019-11-16 DIAGNOSIS — Z789 Other specified health status: Secondary | ICD-10-CM

## 2019-11-16 DIAGNOSIS — O0990 Supervision of high risk pregnancy, unspecified, unspecified trimester: Secondary | ICD-10-CM

## 2019-11-16 DIAGNOSIS — O99012 Anemia complicating pregnancy, second trimester: Secondary | ICD-10-CM

## 2019-11-16 DIAGNOSIS — Z758 Other problems related to medical facilities and other health care: Secondary | ICD-10-CM

## 2019-11-16 DIAGNOSIS — Z3A37 37 weeks gestation of pregnancy: Secondary | ICD-10-CM

## 2019-11-16 NOTE — Patient Instructions (Addendum)
Maternity Assessment Unit (MAU)  The Maternity Assessment Unit (MAU) is located at the [facility] and [place] at [facility]. The address is: [address] [place], [place], [place] [address]. Please see map below for additional directions.    The Maternity Assessment Unit is designed to help you during your pregnancy, and for up to 6 weeks after delivery, with any pregnancy- or postpartum-related emergencies, if you think you are in labor, or if your water has broken. For example, if you experience nausea and vomiting, vaginal bleeding, severe abdominal or pelvic pain, elevated blood pressure or other problems related to your pregnancy or postpartum time, please come to the Maternity Assessment Unit for assistance.        Pregnancy and Anemia  Anemia is a condition in which the concentration of red blood cells, or hemoglobin, in the blood is below normal. Hemoglobin is a substance in red blood cells that carries oxygen to the tissues of the body. Anemia results when enough oxygen does not reach these tissues. Anemia is common during pregnancy because the woman's body needs more blood volume and blood cells to provide nutrition to the fetus. The fetus needs iron and folic acid as it is developing. Your body may not produce enough red blood cells because of this. Also, during pregnancy, the liquid part of the blood (plasma) increases by about 30-50%, and the red blood cells increase by only 20%. This lowers the concentration of the red blood cells and creates a natural anemia-like situation. What are the causes? The most common cause of anemia during pregnancy is not having enough iron in the body to make red blood cells (iron deficiency anemia). Other causes may include:  Folic acid deficiency.  Vitamin B12 deficiency.  Certain prescription or over-the-counter medicines.  Certain medical conditions or infections that destroy red blood cells.  A low platelet count  and bleeding caused by antibodies that go through the placenta to the fetus from the mother's blood. What are the signs or symptoms? Mild anemia may not be noticeable. If it becomes severe, symptoms may include:  Feeling tired (fatigue).  Shortness of breath, especially during activity.  Weakness.  Fainting.  Pale looking skin.  Headaches.  A fast or irregular heartbeat (palpitations).  Dizziness. How is this diagnosed? This condition may be diagnosed based on:  Your medical history and a physical exam.  Blood tests. How is this treated? Treatment for anemia during pregnancy depends on the cause of the anemia. Treatment can include:  Dietary changes.  Supplements of iron, vitamin Q28, or folic acid.  A blood transfusion. This may be needed if anemia is severe.  Hospitalization. This may be needed if there is a lot of blood loss or severe anemia. Follow these instructions at home:  Follow recommendations from your dietitian or health care provider about changing your diet.  Increase your vitamin C intake. This will help the stomach absorb more iron. Some foods that are high in vitamin C include: ? Oranges. ? Peppers. ? Tomatoes. ? Mangoes.  Eat a diet rich in iron. This would include foods such as: ? Liver. ? Beef. ? Eggs. ? Whole grains. ? Spinach. ? Dried fruit.  Take iron and vitamins as told by your health care provider.  Eat green leafy vegetables. These are a good source of folic acid.  Keep all follow-up visits as told by your health care provider. This is important. Contact a health care provider if:  You have frequent or  lasting headaches.  You look pale.  You bruise easily. Get help right away if:  You have extreme weakness, shortness of breath, or chest pain.  You become dizzy or have trouble concentrating.  You have heavy vaginal bleeding.  You develop a rash.  You have bloody or black, tarry stools.  You faint.  You vomit up  blood.  You vomit repeatedly.  You have abdominal pain.  You have a fever.  You are dehydrated. Summary  Anemia is a condition in which the concentration of red blood cells or hemoglobin in the blood is below normal.  Anemia is common during pregnancy because the woman's body needs more blood volume and blood cells to provide nutrition to the fetus.  The most common cause of anemia during pregnancy is not having enough iron in the body to make red blood cells (iron deficiency anemia).  Mild anemia may not be noticeable. If it becomes severe, symptoms may include feeling tired and weak. This information is not intended to replace advice given to you by your health care provider. Make sure you discuss any questions you have with your health care provider. Document Revised: 08/25/2018 Document Reviewed: 04/16/2016 Elsevier Patient Education  [place].         Signs and Symptoms of Labor Labor is your body's natural process of moving your baby, placenta, and umbilical cord out of your uterus. The process of labor usually starts when your baby is full-term, between 32 and 40 weeks of pregnancy. How will I know when I am close to going into labor? As your body prepares for labor and the birth of your baby, you may notice the following symptoms in the weeks and days before true labor starts:  Having a strong desire to get your home ready to receive your new baby. This is called nesting. Nesting may be a sign that labor is approaching, and it may occur several weeks before birth. Nesting may involve cleaning and organizing your home.  Passing a small amount of thick, bloody mucus out of your vagina (normal bloody show or losing your mucus plug). This may happen more than a week before labor begins, or it might occur right before labor begins as the opening of the cervix starts to widen (dilate). For some women, the entire mucus plug passes at once. For others, smaller portions  of the mucus plug may gradually pass over several days.  Your baby moving (dropping) lower in your pelvis to get into position for birth (lightening). When this happens, you may feel more pressure on your bladder and pelvic bone and less pressure on your ribs. This may make it easier to breathe. It may also cause you to need to urinate more often and have problems with bowel movements.  Having "practice contractions" (Braxton Hicks contractions) that occur at irregular (unevenly spaced) intervals that are more than 10 minutes apart. This is also called false labor. False labor contractions are common after exercise or sexual activity, and they will stop if you change position, rest, or drink fluids. These contractions are usually mild and do not get stronger over time. They may feel like: ? A backache or back pain. ? Mild cramps, similar to menstrual cramps. ? Tightening or pressure in your abdomen. Other early symptoms that labor may be starting soon include:  Nausea or loss of appetite.  Diarrhea.  Having a sudden burst of energy, or feeling very tired.  Mood changes.  Having trouble sleeping. How will I  know when labor has begun? Signs that true labor has begun may include:  Having contractions that come at regular (evenly spaced) intervals and increase in intensity. This may feel like more intense tightening or pressure in your abdomen that moves to your back. ? Contractions may also feel like rhythmic pain in your upper thighs or back that comes and goes at regular intervals. ? For first-time mothers, this change in intensity of contractions often occurs at a more gradual pace. ? Women who have given birth before may notice a more rapid progression of contraction changes.  Having a feeling of pressure in the vaginal area.  Your water breaking (rupture of membranes). This is when the sac of fluid that surrounds your baby breaks. When this happens, you will notice fluid leaking from  your vagina. This may be clear or blood-tinged. Labor usually starts within 24 hours of your water breaking, but it may take longer to begin. ? Some women notice this as a gush of fluid. ? Others notice that their underwear repeatedly becomes damp. Follow these instructions at home:   When labor starts, or if your water breaks, call your health care provider or nurse care line. Based on your situation, they will determine when you should go in for an exam.  When you are in early labor, you may be able to rest and manage symptoms at home. Some strategies to try at home include: ? Breathing and relaxation techniques. ? Taking a warm bath or shower. ? Listening to music. ? Using a heating pad on the lower back for pain. If you are directed to use heat:  Place a towel between your skin and the heat source.  Leave the heat on for 20-30 minutes.  Remove the heat if your skin turns bright red. This is especially important if you are unable to feel pain, heat, or cold. You may have a greater risk of getting burned. Get help right away if:  You have painful, regular contractions that are 5 minutes apart or less.  Labor starts before you are [redacted] weeks along in your pregnancy.  You have a fever.  You have a headache that does not go away.  You have bright red blood coming from your vagina.  You do not feel your baby moving.  You have a sudden onset of: ? Severe headache with vision problems. ? Nausea, vomiting, or diarrhea. ? Chest pain or shortness of breath. These symptoms may be an emergency. If your health care provider recommends that you go to the hospital or birth center where you plan to deliver, do not drive yourself. Have someone else drive you, or call emergency services (911 in the [place] Summary  Labor is your body's natural process of moving your baby, placenta, and umbilical cord out of your uterus.  The process of labor usually starts when your baby is full-term, between  61 and 40 weeks of pregnancy.  When labor starts, or if your water breaks, call your health care provider or nurse care line. Based on your situation, they will determine when you should go in for an exam. This information is not intended to replace advice given to you by your health care provider. Make sure you discuss any questions you have with your health care provider. Document Revised: 12/09/2016 Document Reviewed: 08/16/2016 Elsevier Patient Education  [place].        Gestational Diabetes Mellitus, Self Care Caring for yourself after you have been diagnosed with gestational diabetes (  gestational diabetes mellitus) means keeping your blood sugar (glucose) under control. You can do that with a balance of: Nutrition. Exercise. Lifestyle changes. Medicines or insulin, if necessary. Support from your team of health care providers and others. The following information explains what you need to know to manage your gestational diabetes at home. What are the risks? If gestational diabetes is treated, it is unlikely to cause problems. If it is not controlled with treatment, it may cause problems during labor and delivery, and some of those problems can be harmful to the unborn baby (fetus) and the mother. Uncontrolled gestational diabetes may also cause the newborn baby to have breathing problems and low blood glucose. Women who get gestational diabetes are more likely to develop it if they get pregnant again, and they are more likely to develop type 2 diabetes in the future. How to monitor blood glucose  Check your blood glucose every day during your pregnancy. Do this as often as told by your health care provider. Contact your health care provider if your blood glucose is above your target for two tests in a row. Your health care provider will set individualized treatment goals for you. Generally, the goal of treatment is to maintain the following blood glucose levels during  pregnancy: Before meals (preprandial): at or below 95 mg/dL (5.3 mmol/L). After meals (postprandial): One hour after a meal: at or below 140 mg/dL (7.8 mmol/L). Two hours after a meal: at or below 120 mg/dL (6.7 mmol/L). A1c (hemoglobin A1c) level: 6-6.5%. How to manage hyperglycemia and hypoglycemia Hyperglycemia symptoms Hyperglycemia, also called high blood glucose, occurs when blood glucose is too high. Make sure you know the early signs of hyperglycemia, such as: Increased thirst. Hunger. Feeling very tired. Needing to urinate more often than usual. Blurry vision. Hypoglycemia symptoms Hypoglycemia, also called low blood glucose, occurs with a blood glucose level at or below 70 mg/dL (3.9 mmol/L). The risk for hypoglycemia increases during or after exercise, during sleep, during illness, and when skipping meals or not eating for a long time (fasting). Symptoms may include: Hunger. Anxiety. Sweating and feeling clammy. Confusion. Dizziness or feeling light-headed. Sleepiness. Nausea. Increased heart rate. Headache. Blurry vision. Irritability. Tingling or numbness around the mouth, lips, or tongue. A change in coordination. Restless sleep. Fainting. Seizure. It is important to know the symptoms of hypoglycemia and treat it right away. Always have a 15-gram rapid-acting carbohydrate snack with you to treat low blood glucose. Family members and close friends should also know the symptoms and should understand how to treat hypoglycemia, in case you are not able to treat yourself. Treating hypoglycemia If you are alert and able to swallow safely, follow the 15:15 rule: Take 15 grams of a rapid-acting carbohydrate. Talk with your health care provider about how much you should take. Rapid-acting options include: Glucose pills (take 15 grams). 6-8 pieces of hard candy. 4-6 oz (120-150 mL) of fruit juice. 4-6 oz (120-150 mL) of regular (not diet) soda. 1 Tbsp (15 mL) honey or  sugar. Check your blood glucose 15 minutes after you take the carbohydrate. If the repeat blood glucose level is still at or below 70 mg/dL (3.9 mmol/L), take 15 grams of a carbohydrate again. If your blood glucose level does not increase above 70 mg/dL (3.9 mmol/L) after 3 tries, seek emergency medical care. After your blood glucose level returns to normal, eat a meal or a snack within 1 hour. Treating severe hypoglycemia Severe hypoglycemia is when your blood glucose level is  at or below 54 mg/dL (3 mmol/L). Severe hypoglycemia is an emergency. Do not wait to see if the symptoms will go away. Get medical help right away. Call your local emergency services (911 in the [place] If you have severe hypoglycemia and you cannot eat or drink, you may need an injection of glucagon. A family member or close friend should learn how to check your blood glucose and how to give you a glucagon injection. Ask your health care provider if you need to have an emergency glucagon injection kit available. Severe hypoglycemia may need to be treated in a hospital. The treatment may include getting glucose through an IV. You may also need treatment for the cause of your hypoglycemia. Follow these instructions at home: Take diabetes medicines as told If your health care provider prescribed insulin or diabetes medicines, take them every day. Do not run out of insulin or other diabetes medicines that you take. Plan ahead so you always have these available. If you use insulin, adjust your dosage based on how physically active you are and what foods you eat. Your health care provider will tell you how to adjust your dosage. Make healthy food choices  The things that you eat and drink affect your blood glucose (and your insulin dose, if this applies). Making good choices helps to control your diabetes and prevent other health problems. A healthy meal plan includes eating lean proteins, complex carbohydrates, fresh fruits and  vegetables, low-fat dairy products, and healthy fats. Make an appointment to see a diet and nutrition specialist (registered dietitian) to help you create an eating plan that is right for you. Make sure that you: Follow instructions from your health care provider about eating or drinking restrictions. Drink enough fluid to keep your urine pale yellow. Eat healthy snacks between nutritious meals. Keep a record of the carbohydrates that you eat. Do this by reading food labels and learning the standard serving sizes of foods. Follow your sick day plan whenever you cannot eat or drink as usual. Make this plan in advance with your health care provider.  Stay active Do 30 or more minutes of physical activity a day, or as much physical activity as your health care provider recommends during your pregnancy. Doing 10 minutes of exercise starting 30 minutes after each meal may help to control postprandial blood glucose levels. If you start a new exercise or activity, work with your health care provider to adjust your insulin, medicines, or food intake as needed. Make healthy lifestyle choices Do not drink alcohol. Do not use any tobacco products, such as cigarettes, chewing tobacco, and e-cigarettes. If you need help quitting, ask your health care provider. Learn to manage stress. If you need help with this, ask your health care provider. Care for your body Keep your immunizations up to date. Brush your teeth and gums two times a day, and floss one or more times a day. Visit your dentist one or more times every 6 months. Maintain a healthy weight during your pregnancy. General instructions Take over-the-counter and prescription medicines only as told by your health care provider. Talk with your health care provider about your risk for high blood pressure during pregnancy (preeclampsia or eclampsia). Share your diabetes management plan with people in your workplace, school, and household. Check your  urine for ketones during your pregnancy when you are ill and as told by your health care provider. Carry a medical alert card or wear medical alert jewelry that says you have gestational diabetes.  Keep all follow-up visits during your pregnancy (prenatal) and after delivery (postnatal) as told by your health care provider. This is important. Get the care that you need after delivery Have your blood glucose level checked 4-12 weeks after delivery. This is done with an oral glucose tolerance test (OGTT). Get screened for diabetes at least every 3 years, or as often as told by your health care provider. Questions to ask your health care provider Do I need to meet with a diabetes educator? Where can I find a support group for people with gestational diabetes? Where to find more information For more information about gestational diabetes, visit: American Diabetes Association (ADA): www.diabetes.org Centers for Disease Control and Prevention (CDC): http://www.wolf.info/ Summary Check your blood glucose every day during your pregnancy. Do this as often as told by your health care provider. If your health care provider prescribed insulin or diabetes medicines, take them every day as told. Keep all follow-up visits during your pregnancy (prenatal) and after delivery (postnatal) as told by your health care provider. This is important. Have your blood glucose level checked 4-12 weeks after delivery. This information is not intended to replace advice given to you by your health care provider. Make sure you discuss any questions you have with your health care provider. Document Revised: 09/01/2017 Document Reviewed: 04/14/2015 Elsevier Patient Education  2020 [employer].

## 2019-11-17 LAB — CBC
Hematocrit: 35.6 % (ref 34.0–46.6)
Hemoglobin: 11.5 g/dL (ref 11.1–15.9)
MCH: 22.3 pg — ABNORMAL LOW (ref 26.6–33.0)
MCHC: 32.3 g/dL (ref 31.5–35.7)
MCV: 69 fL — ABNORMAL LOW (ref 79–97)
Platelets: 245 10*3/uL (ref 150–450)
RBC: 5.15 x10E6/uL (ref 3.77–5.28)
RDW: 18.4 % — ABNORMAL HIGH (ref 11.7–15.4)
WBC: 8.2 10*3/uL (ref 3.4–10.8)

## 2019-11-17 LAB — FERRITIN: Ferritin: 18 ng/mL (ref 15–150)

## 2019-11-23 ENCOUNTER — Other Ambulatory Visit: Payer: Self-pay

## 2019-11-23 ENCOUNTER — Ambulatory Visit (INDEPENDENT_AMBULATORY_CARE_PROVIDER_SITE_OTHER): Payer: Medicaid Other | Admitting: Obstetrics and Gynecology

## 2019-11-23 ENCOUNTER — Encounter: Payer: Self-pay | Admitting: Obstetrics and Gynecology

## 2019-11-23 VITALS — BP 123/80 | HR 82 | Wt 140.0 lb

## 2019-11-23 DIAGNOSIS — Z789 Other specified health status: Secondary | ICD-10-CM

## 2019-11-23 DIAGNOSIS — O0993 Supervision of high risk pregnancy, unspecified, third trimester: Secondary | ICD-10-CM

## 2019-11-23 DIAGNOSIS — D649 Anemia, unspecified: Secondary | ICD-10-CM

## 2019-11-23 DIAGNOSIS — Z8759 Personal history of other complications of pregnancy, childbirth and the puerperium: Secondary | ICD-10-CM

## 2019-11-23 DIAGNOSIS — Z603 Acculturation difficulty: Secondary | ICD-10-CM

## 2019-11-23 DIAGNOSIS — O0990 Supervision of high risk pregnancy, unspecified, unspecified trimester: Secondary | ICD-10-CM

## 2019-11-23 DIAGNOSIS — Z7189 Other specified counseling: Secondary | ICD-10-CM

## 2019-11-23 DIAGNOSIS — O09299 Supervision of pregnancy with other poor reproductive or obstetric history, unspecified trimester: Secondary | ICD-10-CM

## 2019-11-23 DIAGNOSIS — O24410 Gestational diabetes mellitus in pregnancy, diet controlled: Secondary | ICD-10-CM

## 2019-11-23 DIAGNOSIS — O99012 Anemia complicating pregnancy, second trimester: Secondary | ICD-10-CM

## 2019-11-23 DIAGNOSIS — O09293 Supervision of pregnancy with other poor reproductive or obstetric history, third trimester: Secondary | ICD-10-CM

## 2019-11-23 DIAGNOSIS — Z3A38 38 weeks gestation of pregnancy: Secondary | ICD-10-CM

## 2019-11-23 NOTE — Progress Notes (Addendum)
   PRENATAL VISIT NOTE  Subjective:  Courtney Clark is a 29 y.o. G4P3003 at [redacted]w[redacted]d being seen today for ongoing prenatal care.  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  Patient reports no complaints.  Contractions: Not present. Vag. Bleeding: None.  Movement: Present. Denies leaking of fluid.   The following portions of the patient's history were reviewed and updated as appropriate: allergies, current medications, past family history, past medical history, past social history, past surgical history and problem list.   Objective:   Vitals:   11/23/19 0831  BP: 123/80  Pulse: 82  Weight: 140 lb (63.5 kg)    Fetal Status: Fetal Heart Rate (bpm): 141   Movement: Present     General:  Alert, oriented and cooperative. Patient is in no acute distress.  Skin: Skin is warm and dry. No rash noted.   Cardiovascular: Normal heart rate noted  Respiratory: Normal respiratory effort, no problems with respiration noted  Abdomen: Soft, gravid, appropriate for gestational age.  Pain/Pressure: Present     Pelvic: Cervical exam deferred        Extremities: Normal range of motion.  Edema: None  Mental Status: Normal mood and affect. Normal behavior. Normal judgment and thought content.   Assessment and Plan:  Pregnancy: G4P3003 at [redacted]w[redacted]d  1. Diet controlled gestational diabetes mellitus (GDM) in third trimester Paper log reviewed, CBGs very well controlled Last growth 23rd%tile IOL scheduled for 40 weeks, pt prefers to wait on induction as long as possible  2. Supervision of high risk pregnancy, antepartum  3. Language barrier Montagnard interpretor used  4.  History of postpartum hemorrhage, currently pregnant  5. Anemia in pregnancy, second trimester  6. Counseled about COVID-19 virus infection The patient was counseled on the potential benefits and lack of known risks of COVID vaccination, during pregnancy and breastfeeding, on today's visit. The patient's questions and concerns were addressed today, including being unsure and afraid of getting vaccinated. The patient is not planning to get vaccinated at this time.   7. [redacted] weeks gestation of pregnancy    Term labor symptoms and general obstetric precautions including but not limited to vaginal bleeding, contractions, leaking of fluid and fetal movement were reviewed in detail with the patient. Please refer to After Visit Summary for other counseling recommendations.   Return in about 1 week (around 11/30/2019) for high OB, in person.  Future Appointments  Date Time Provider Department Center  11/30/2019  8:15 AM Adam Phenix, MD [facility]  12/07/2019  8:15 AM Conan Bowens, MD [facility]  12/14/2019  8:15 AM Adam Phenix, MD [facility]    Conan Bowens, MD

## 2019-11-29 ENCOUNTER — Encounter (HOSPITAL_COMMUNITY): Payer: Self-pay | Admitting: Obstetrics and Gynecology

## 2019-11-29 ENCOUNTER — Other Ambulatory Visit: Payer: Self-pay | Admitting: Advanced Practice Midwife

## 2019-11-29 ENCOUNTER — Other Ambulatory Visit: Payer: Self-pay

## 2019-11-29 ENCOUNTER — Inpatient Hospital Stay (HOSPITAL_COMMUNITY)
Admission: AD | Admit: 2019-11-29 | Discharge: 2019-12-01 | DRG: 807 | Disposition: A | Discharge status: Home / Self Care | Payer: Medicaid Other | Attending: Obstetrics and Gynecology | Admitting: Obstetrics and Gynecology

## 2019-11-29 DIAGNOSIS — Z20822 Contact with and (suspected) exposure to covid-19: Secondary | ICD-10-CM | POA: Diagnosis present

## 2019-11-29 DIAGNOSIS — O9902 Anemia complicating childbirth: Secondary | ICD-10-CM | POA: Diagnosis present

## 2019-11-29 DIAGNOSIS — Z8759 Personal history of other complications of pregnancy, childbirth and the puerperium: Secondary | ICD-10-CM

## 2019-11-29 DIAGNOSIS — O4202 Full-term premature rupture of membranes, onset of labor within 24 hours of rupture: Secondary | ICD-10-CM | POA: Diagnosis not present

## 2019-11-29 DIAGNOSIS — O285 Abnormal chromosomal and genetic finding on antenatal screening of mother: Secondary | ICD-10-CM | POA: Diagnosis present

## 2019-11-29 DIAGNOSIS — O0990 Supervision of high risk pregnancy, unspecified, unspecified trimester: Secondary | ICD-10-CM

## 2019-11-29 DIAGNOSIS — O99012 Anemia complicating pregnancy, second trimester: Secondary | ICD-10-CM | POA: Diagnosis present

## 2019-11-29 DIAGNOSIS — O0932 Supervision of pregnancy with insufficient antenatal care, second trimester: Secondary | ICD-10-CM

## 2019-11-29 DIAGNOSIS — Z3A39 39 weeks gestation of pregnancy: Secondary | ICD-10-CM

## 2019-11-29 DIAGNOSIS — O24420 Gestational diabetes mellitus in childbirth, diet controlled: Secondary | ICD-10-CM | POA: Diagnosis not present

## 2019-11-29 DIAGNOSIS — O26893 Other specified pregnancy related conditions, third trimester: Secondary | ICD-10-CM | POA: Diagnosis not present

## 2019-11-29 DIAGNOSIS — O24410 Gestational diabetes mellitus in pregnancy, diet controlled: Secondary | ICD-10-CM | POA: Diagnosis present

## 2019-11-29 DIAGNOSIS — Z789 Other specified health status: Secondary | ICD-10-CM | POA: Diagnosis present

## 2019-11-29 LAB — CBC
HCT: 37.9 % (ref 36.0–46.0)
Hemoglobin: 12.1 g/dL (ref 12.0–15.0)
MCH: 22.1 pg — ABNORMAL LOW (ref 26.0–34.0)
MCHC: 31.9 g/dL (ref 30.0–36.0)
MCV: 69.2 fL — ABNORMAL LOW (ref 80.0–100.0)
Platelets: 266 10*3/uL (ref 150–400)
RBC: 5.48 MIL/uL — ABNORMAL HIGH (ref 3.87–5.11)
RDW: 18.5 % — ABNORMAL HIGH (ref 11.5–15.5)
WBC: 10.4 10*3/uL (ref 4.0–10.5)
nRBC: 0 % (ref 0.0–0.2)

## 2019-11-29 LAB — TYPE AND SCREEN
ABO/RH(D): O POS
Antibody Screen: NEGATIVE

## 2019-11-29 LAB — SARS CORONAVIRUS 2 BY RT PCR (HOSPITAL ORDER, PERFORMED IN ~~LOC~~ HOSPITAL LAB): SARS Coronavirus 2: NEGATIVE

## 2019-11-29 LAB — GLUCOSE, CAPILLARY: Glucose-Capillary: 72 mg/dL (ref 70–99)

## 2019-11-29 MED ORDER — LACTATED RINGERS IV SOLN: INTRAVENOUS | Status: DC

## 2019-11-29 MED ORDER — LACTATED RINGERS IV SOLN: 500.0000 mL | INTRAVENOUS | Status: DC | PRN

## 2019-11-29 MED ORDER — ONDANSETRON HCL 4 MG/2ML IJ SOLN: 4.0000 mg | Freq: Four times a day (QID) | INTRAMUSCULAR | Status: DC | PRN

## 2019-11-29 MED ORDER — SOD CITRATE-CITRIC ACID 500-334 MG/5ML PO SOLN: 30.0000 mL | ORAL | Status: DC | PRN

## 2019-11-29 MED ORDER — OXYCODONE-ACETAMINOPHEN 5-325 MG PO TABS: 2.0000 | ORAL_TABLET | ORAL | Status: DC | PRN

## 2019-11-29 MED ORDER — OXYTOCIN BOLUS FROM INFUSION
333.0000 mL | Freq: Once | INTRAVENOUS | Status: AC
  Administered 2019-11-29: 333 mL via INTRAVENOUS

## 2019-11-29 MED ORDER — OXYTOCIN-SODIUM CHLORIDE 30-0.9 UT/500ML-% IV SOLN
2.5000 [IU]/h | INTRAVENOUS | Status: DC
  Filled 2019-11-29: qty 500

## 2019-11-29 MED ORDER — OXYCODONE-ACETAMINOPHEN 5-325 MG PO TABS: 1.0000 | ORAL_TABLET | ORAL | Status: DC | PRN

## 2019-11-29 MED ORDER — LIDOCAINE HCL (PF) 1 % IJ SOLN
30.0000 mL | INTRAMUSCULAR | Status: DC | PRN
  Filled 2019-11-29: qty 30

## 2019-11-29 MED ORDER — FENTANYL CITRATE (PF) 100 MCG/2ML IJ SOLN: 100.0000 ug | INTRAMUSCULAR | Status: DC | PRN

## 2019-11-29 MED ORDER — ACETAMINOPHEN 325 MG PO TABS: 650.0000 mg | ORAL_TABLET | ORAL | Status: DC | PRN

## 2019-11-29 NOTE — H&P (Signed)
Courtney Clark is a 29 y.o. female [phone] @ 39.0wks by LMP c/w 27wk u/s presenting for reg ctx. Denies leaking or bldg; no H/A, N/V or visual disturbances. 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 Anemia    PP  . History of diet controlled gestational diabetes mellitus (GDM) 08/17/2019  . Language barrier 05/21/2012  . NSVD (normal spontaneous vaginal delivery) 05/21/2012   Past Surgical History:  Procedure Laterality Date  . NO PAST SURGERIES     Family History: family history includes Heart disease in her mother. Social History:  reports that she has never smoked. She has never used smokeless tobacco. She reports that she does not drink alcohol and does not use drugs.     Maternal Diabetes: Yes:  Diabetes Type:  Diet controlled Genetic Screening: Declined Maternal Ultrasounds/Referrals: Normal Fetal Ultrasounds or other Referrals:  None Maternal Substance Abuse:  No Significant Maternal Medications:  None Significant Maternal Lab Results:  Group B Strep negative Other Comments:  None  Review of Systems History Dilation: 7 Effacement (%): 90 Station: -1 Exam by:: weston,rn Blood pressure 129/88, pulse 81, temperature 98.7 F (37.1 C), temperature source Oral, resp. rate 18, height 5\' 2"  (1.575 m), weight 64.9 kg, last menstrual period 03/01/2019, SpO2 100 %, unknown if currently breastfeeding. Exam Physical Exam Constitutional:      Appearance: Normal appearance.  HENT:     Head: Normocephalic.     Nose: Nose normal.     Mouth/Throat:     Mouth: Mucous membranes are  moist.  Cardiovascular:     Rate and Rhythm: Normal rate.  Pulmonary:     Effort: Pulmonary effort is normal.  Abdominal:     Comments: EFM 135-140, +accels, occ variables Ctx q 3 mins  Musculoskeletal:        General: Normal range of motion.     Cervical back: Normal range of motion.  Skin:    General: Skin is warm and dry.  Neurological:     General: No focal deficit present.     Mental Status: She is alert.  Psychiatric:        Mood and Affect: Mood normal.        Behavior: Behavior normal.     Prenatal labs: ABO, Rh: --/--/PENDING (09/06 2220) Antibody: PENDING (09/06 2220) Rubella: 4.43 (05/25 1452) RPR: Non Reactive (06/21 0906)  HBsAg: Negative (05/25 1452)  HIV: Non Reactive (05/25 1452)  GBS: Negative/-- (08/17 1022)   Assessment/Plan: IUP@39 .0wks GDMA1 Anemia Active labor  Admit to Labor and Delivery  Expectant management Anticipate vag del (be prepared for PPH)  05-14-1974 CNM 11/29/2019, 10:35 PM

## 2019-11-29 NOTE — MAU Note (Signed)
Pt presents with complaint of contractions, denies ROM, complains of pressure

## 2019-11-30 ENCOUNTER — Encounter: Payer: Self-pay | Admitting: Obstetrics & Gynecology

## 2019-11-30 ENCOUNTER — Encounter (HOSPITAL_COMMUNITY): Payer: Self-pay | Admitting: Obstetrics and Gynecology

## 2019-11-30 LAB — RPR: RPR Ser Ql: NONREACTIVE

## 2019-11-30 MED ORDER — ZOLPIDEM TARTRATE 5 MG PO TABS: 5.0000 mg | ORAL_TABLET | Freq: Every evening | ORAL | Status: DC | PRN

## 2019-11-30 MED ORDER — DIPHENHYDRAMINE HCL 25 MG PO CAPS: 25.0000 mg | ORAL_CAPSULE | Freq: Four times a day (QID) | ORAL | Status: DC | PRN

## 2019-11-30 MED ORDER — SENNOSIDES-DOCUSATE SODIUM 8.6-50 MG PO TABS
2.0000 | ORAL_TABLET | ORAL | Status: DC
  Administered 2019-12-01: 2 via ORAL
  Filled 2019-11-30: qty 2

## 2019-11-30 MED ORDER — IBUPROFEN 600 MG PO TABS
600.0000 mg | ORAL_TABLET | Freq: Four times a day (QID) | ORAL | Status: DC
  Administered 2019-11-30 – 2019-12-01 (×6): 600 mg via ORAL
  Filled 2019-11-30 (×6): qty 1

## 2019-11-30 MED ORDER — COCONUT OIL OIL: 1.0000 "application " | TOPICAL_OIL | Status: DC | PRN

## 2019-11-30 MED ORDER — OXYCODONE HCL 5 MG PO TABS
5.0000 mg | ORAL_TABLET | ORAL | Status: DC | PRN
  Administered 2019-11-30: 5 mg via ORAL
  Filled 2019-11-30: qty 1

## 2019-11-30 MED ORDER — ONDANSETRON HCL 4 MG/2ML IJ SOLN: 4.0000 mg | INTRAMUSCULAR | Status: DC | PRN

## 2019-11-30 MED ORDER — BENZOCAINE-MENTHOL 20-0.5 % EX AERO: 1.0000 "application " | INHALATION_SPRAY | CUTANEOUS | Status: DC | PRN

## 2019-11-30 MED ORDER — MEASLES, MUMPS & RUBELLA VAC IJ SOLR: 0.5000 mL | Freq: Once | INTRAMUSCULAR | Status: DC

## 2019-11-30 MED ORDER — ONDANSETRON HCL 4 MG PO TABS: 4.0000 mg | ORAL_TABLET | ORAL | Status: DC | PRN

## 2019-11-30 MED ORDER — MISOPROSTOL 200 MCG PO TABS
ORAL_TABLET | ORAL | Status: AC
  Administered 2019-11-30: 800 ug via RECTAL
  Filled 2019-11-30: qty 4

## 2019-11-30 MED ORDER — PRENATAL MULTIVITAMIN CH
1.0000 | ORAL_TABLET | Freq: Every day | ORAL | Status: DC
  Administered 2019-11-30 – 2019-12-01 (×2): 1 via ORAL
  Filled 2019-11-30 (×2): qty 1

## 2019-11-30 MED ORDER — MISOPROSTOL 200 MCG PO TABS: 800.0000 ug | ORAL_TABLET | Freq: Once | ORAL | Status: AC

## 2019-11-30 MED ORDER — SIMETHICONE 80 MG PO CHEW: 80.0000 mg | CHEWABLE_TABLET | ORAL | Status: DC | PRN

## 2019-11-30 MED ORDER — TETANUS-DIPHTH-ACELL PERTUSSIS 5-2.5-18.5 LF-MCG/0.5 IM SUSP: 0.5000 mL | Freq: Once | INTRAMUSCULAR | Status: DC

## 2019-11-30 MED ORDER — WITCH HAZEL-GLYCERIN EX PADS: 1.0000 "application " | MEDICATED_PAD | CUTANEOUS | Status: DC | PRN

## 2019-11-30 MED ORDER — DIBUCAINE (PERIANAL) 1 % EX OINT: 1.0000 "application " | TOPICAL_OINTMENT | CUTANEOUS | Status: DC | PRN

## 2019-11-30 MED ORDER — ACETAMINOPHEN 325 MG PO TABS
650.0000 mg | ORAL_TABLET | ORAL | Status: DC | PRN
  Administered 2019-11-30 (×2): 650 mg via ORAL
  Filled 2019-11-30 (×2): qty 2

## 2019-11-30 NOTE — Discharge Summary (Signed)
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 yo [id number] 355w1das admitted in Active Labor on 11/29/2019. Patient had an uncomplicated labor course, with SROM just prior to delivery for light MSF.  Membrane Rupture Time/Date: 11:37 PM ,11/29/2019   Delivery Method:Vaginal, Spontaneous  Episiotomy: None  Lacerations:  None  Patient had an uncomplicated postpartum course.  She is ambulating, tolerating a regular diet, passing flatus, and urinating well. Patient is discharged home in stable condition on 12/01/19.  Newborn Data: Birth date:11/29/2019  Birth time:11:38 PM  Gender:Female  Living status:Living  Apgars:9 ,9  Weight:2860 g  2860gm (6lb 4.9oz)  Magnesium Sulfate received: No BMZ received: No Rhophylac:N/A MMR:N/A T-DaP:Given prenatally Flu: N/A Transfusion:No  Physical exam  Vitals:   11/30/19 0854 11/30/19 1453 11/30/19 2109 12/01/19 0513  BP: 106/68 100/70 100/68 98/63  Pulse: 80 73 70 64  Resp: '18 18 16 18  ' Temp: 98.1 F (36.7 C) 98.5 F (36.9 C) 98.1 F (36.7 C) 98.3 F  (36.8 C)  TempSrc: Oral Oral Oral Oral  SpO2:  98% 98% 99%  Weight:      Height:       General: alert, cooperative and no distress Lochia: appropriate Uterine Fundus: firm Incision: Dressing is clean, dry, and intact DVT Evaluation: No evidence of DVT seen on physical exam. Labs: Lab Results  Component Value Date   WBC 10.4 11/29/2019   HGB 12.1 11/29/2019   HCT 37.9 11/29/2019   MCV 69.2 (L) 11/29/2019   PLT 266 11/29/2019   CMP Latest Ref Rng & Units 08/13/2017  Glucose 65 - 99 mg/dL 75   Edinburgh Score: Edinburgh Postnatal Depression Scale Screening Tool 11/30/2019  I have been able to laugh and see the funny side of things. 0  I have looked forward with enjoyment to things. 0  I have blamed myself unnecessarily when things went wrong. 0  I have been anxious or worried for no good reason. 0  I have felt scared or panicky for no good reason. 0  Things have been getting on top of me. 0  I have been so unhappy that I have had difficulty sleeping. 0  I have felt sad or miserable. 0  I have been so unhappy that I have been crying. 0  The thought of harming myself has occurred to me. 0  Edinburgh Postnatal Depression Scale Total 0  After visit meds:  Allergies as of 12/01/2019   No Known Allergies     Medication List    STOP taking these medications   Accu-Chek Guide test strip Generic drug: glucose blood   Accu-Chek Softclix Lancets lancets     TAKE these medications   acetaminophen 325 MG tablet Commonly known as: Tylenol Take 2 tablets (650 mg total) by mouth every 4 (four) hours as needed (for pain scale < 4).   ferrous sulfate 325 (65 FE) MG tablet Take 1 tablet (325 mg total) by mouth daily with breakfast. Best to take with a cup of orange juice.   ibuprofen 600 MG tablet Commonly known as: ADVIL Take 1 tablet (600 mg total) by mouth every 6 (six) hours.   prenatal multivitamin Tabs tablet Take 1 tablet by mouth daily.   senna-docusate 8.6-50 MG  tablet Commonly known as: Senokot-S Take 2 tablets by mouth daily. Start taking on: December 02, 2019        Discharge home in stable condition Infant Feeding: Bottle and Breast Infant Disposition:home with mother Discharge instruction: per After Visit Summary and Postpartum booklet. Activity: Advance as tolerated. Pelvic rest for 6 weeks.  Diet: routine diet Future Appointments: Future Appointments  Date Time Provider [place]  12/04/2019  9:40 AM [facility]-SCREENING [facility] None  12/29/2019  8:50 AM [facility] LAB [facility]  12/29/2019 10:15 AM Laury Deep, CNM [facility]   Follow up Visit:  [place] [facility]. Schedule an appointment as soon as possible for a visit in 6 week(s).   Specialty: Obstetrics and Gynecology Contact information: [place] [address] [phone]              Myrtis Ser, CNM  P [facility] Admin Pool Please schedule this patient for Postpartum visit in: 4 weeks with the following provider: Any provider  Virtual  For C/S patients schedule nurse incision check in weeks 2 weeks: n/a   High risk pregnancy complicated by: GDM  Delivery mode: SVD  Anticipated Birth Control: Condoms  PP Procedures needed: 2 hour GTT & Pap  Schedule Integrated BH visit: no   12/01/2019 Janet Berlin, MD

## 2019-11-30 NOTE — Discharge Instructions (Signed)

## 2019-11-30 NOTE — Progress Notes (Addendum)
POSTPARTUM PROGRESS NOTE  Post Partum Day 1  Subjective:  Courtney Clark is a 29 y.o. [id number] s/p SVD at [redacted]w[redacted]d.  She reports she is doing well. No acute events overnight. She denies any problems with ambulating, voiding or po intake. Denies nausea or vomiting.  Pain is well controlled.  Lochia is mild.  Objective: Blood pressure 106/68, pulse 80, temperature 98.1 F (36.7 C), temperature source Oral, resp. rate 18, height 5\' 2"  (1.575 m), weight 64.9 kg, last menstrual period 03/01/2019, SpO2 100 %, unknown if currently breastfeeding.  Physical Exam:  General: alert, cooperative and no distress Chest: no respiratory distress Heart:regular rate, distal pulses intact Abdomen: soft, nontender Uterine Fundus: firm, appropriately tender DVT Evaluation: No calf swelling or tenderness Extremities: no edema Skin: warm, dry  Recent Labs    11/29/19 2223  HGB 12.1  HCT 37.9    Assessment/Plan: Courtney Clark is a 29 y.o. 34 s/p SVD at [redacted]w[redacted]d   PPD#1 - Doing well  Routine postpartum care  Contraception: Condoms, aware of other options as well  Feeding: breast and formula  Dispo: Plan for discharge tomorrow.   LOS: 1 day   [redacted]w[redacted]d, DO Family Medicine PGY-3 11/30/2019, 9:26 AM  Attestation of Supervision of Student:  I confirm that I have verified the information documented in the  resident  student's note and that I have also personally  reperformed   the history, physical exam and all medical decision making activities.  I have verified that all services and findings are accurately documented in this student's note; and I agree with management and plan as outlined in the documentation. I have also made any necessary editorial changes.   01/30/2020, CNM Center for [facility], [facility] 11/30/2019 11:15 AM

## 2019-12-01 MED ORDER — SENNOSIDES-DOCUSATE SODIUM 8.6-50 MG PO TABS: 2.0000 | ORAL_TABLET | ORAL | Status: DC

## 2019-12-01 MED ORDER — IBUPROFEN 600 MG PO TABS
600.0000 mg | ORAL_TABLET | Freq: Four times a day (QID) | ORAL | 0 refills | Status: DC
Start: 2019-12-01 — End: 2020-08-23

## 2019-12-01 MED ORDER — ACETAMINOPHEN 325 MG PO TABS: 650.0000 mg | ORAL_TABLET | ORAL | Status: DC | PRN

## 2019-12-02 ENCOUNTER — Telehealth: Payer: Self-pay | Admitting: *Deleted

## 2019-12-02 NOTE — Telephone Encounter (Addendum)
Assisted by Diah,  Interpreter #JRDS, Transition Care Management Unsuccessful Follow-up Telephone Call  Date of discharge and from where: 12/01/19, [facility]  Attempts:  1st Attempt  Reason for unsuccessful TCM follow-up call:  Left voice message. Burnard Bunting, RN, BSN, CCRN Patient Engagement Center [phone]

## 2019-12-03 ENCOUNTER — Telehealth: Payer: Self-pay | Admitting: *Deleted

## 2019-12-03 NOTE — Telephone Encounter (Signed)
Assisted by Revonda Standard [phone]. Transition Care Management Unsuccessful Follow-up Telephone Call  Date of discharge and from where: 12/01/19, [facility]   Attempts:  2nd Attempt  Reason for unsuccessful TCM follow-up call:  Left voice message.  Burnard Bunting, RN, BSN, CCRN Patient Engagement Center [phone]

## 2019-12-04 ENCOUNTER — Other Ambulatory Visit (HOSPITAL_COMMUNITY): Payer: Medicaid Other

## 2019-12-06 ENCOUNTER — Inpatient Hospital Stay (HOSPITAL_COMMUNITY)
Admission: AD | Admit: 2019-12-06 | Payer: Medicaid Other | Source: Home / Self Care | Admitting: Obstetrics and Gynecology

## 2019-12-06 ENCOUNTER — Inpatient Hospital Stay (HOSPITAL_COMMUNITY): Payer: Medicaid Other

## 2019-12-06 ENCOUNTER — Telehealth: Payer: Self-pay | Admitting: *Deleted

## 2019-12-06 NOTE — Telephone Encounter (Signed)
Assisted by Mohammed Kindle, Interpreter # [phone].  Transition Care Management Unsuccessful Follow-up Telephone Call  Date of discharge and from where:  12/01/19, [facility]  Attempts:  3rd Attempt  Reason for unsuccessful TCM follow-up call:  Left voice message.  Burnard Bunting, RN, BSN, CCRN Patient Engagement Center [phone]

## 2019-12-07 ENCOUNTER — Encounter: Payer: Self-pay | Admitting: Obstetrics and Gynecology

## 2019-12-14 ENCOUNTER — Encounter: Payer: Self-pay | Admitting: Obstetrics & Gynecology

## 2019-12-28 ENCOUNTER — Telehealth: Payer: Self-pay | Admitting: Family Medicine

## 2019-12-28 NOTE — Telephone Encounter (Signed)
Tried calling patient several times was unable to get through I call language Resources and they had an Interpreter to call me after 4:30. His number was [phone],  We was unable to leave patient a message, we will not have a interpreter for her on the day of her appointment 12-29-2019, we may have ro reschedule .

## 2019-12-29 ENCOUNTER — Other Ambulatory Visit: Payer: Medicaid Other

## 2019-12-29 ENCOUNTER — Encounter: Payer: Self-pay | Admitting: Obstetrics and Gynecology

## 2019-12-29 ENCOUNTER — Other Ambulatory Visit (HOSPITAL_COMMUNITY)
Admission: RE | Admit: 2019-12-29 | Discharge: 2019-12-29 | Disposition: A | Discharge status: Home / Self Care | Payer: Medicaid Other | Source: Ambulatory Visit | Attending: Obstetrics and Gynecology | Admitting: Obstetrics and Gynecology

## 2019-12-29 ENCOUNTER — Ambulatory Visit (INDEPENDENT_AMBULATORY_CARE_PROVIDER_SITE_OTHER): Payer: Medicaid Other | Admitting: Obstetrics and Gynecology

## 2019-12-29 ENCOUNTER — Other Ambulatory Visit: Payer: Self-pay | Admitting: General Practice

## 2019-12-29 ENCOUNTER — Other Ambulatory Visit: Payer: Self-pay

## 2019-12-29 DIAGNOSIS — Z30011 Encounter for initial prescription of contraceptive pills: Secondary | ICD-10-CM

## 2019-12-29 DIAGNOSIS — O24439 Gestational diabetes mellitus in the puerperium, unspecified control: Secondary | ICD-10-CM

## 2019-12-29 DIAGNOSIS — Z01419 Encounter for gynecological examination (general) (routine) without abnormal findings: Secondary | ICD-10-CM

## 2019-12-29 DIAGNOSIS — O24410 Gestational diabetes mellitus in pregnancy, diet controlled: Secondary | ICD-10-CM

## 2019-12-29 MED ORDER — NORETHINDRONE 0.35 MG PO TABS: 1.0000 | ORAL_TABLET | Freq: Every day | ORAL | 6 refills | Status: DC

## 2019-12-29 NOTE — Progress Notes (Signed)
Post Partum Visit Note  Courtney Clark is a 29 y.o. [id number] female who presents for a postpartum visit. She is 4 weeks postpartum following a normal spontaneous vaginal delivery.  I have fully reviewed the prenatal and intrapartum course. The delivery was at 39.1 gestational weeks.  Anesthesia: none. Postpartum course has been unremrkable. Baby is doing well. Baby is feeding by bottle - gerber good start. Bleeding no bleeding. Bowel function is normal. Bladder function is normal. Patient is not sexually active. Contraception method is oral progesterone-only contraceptive. Postpartum depression screening: negative.   The pregnancy intention screening data noted above was reviewed. Potential methods of contraception were discussed. The patient elected to proceed with Oral Contraceptive.    Edinburgh Postnatal Depression Scale - 12/29/19 0933      Edinburgh Postnatal Depression Scale:  In the Past 7 Days   I have been able to laugh and see the funny side of things. 0    I have looked forward with enjoyment to things. 0    I have blamed myself unnecessarily when things went wrong. 0    I have been anxious or worried for no good reason. 0    I have felt scared or panicky for no good reason. 0    Things have been getting on top of me. 0    I have been so unhappy that I have had difficulty sleeping. 0    I have felt sad or miserable. 0    I have been so unhappy that I have been crying. 0    The thought of harming myself has occurred to me. 0    Edinburgh Postnatal Depression Scale Total 0          The following portions of the patient's history were reviewed and updated as appropriate: allergies, current medications, past family history, past medical history, past social history, past surgical history and problem list.  Review of Systems Constitutional: negative Eyes: negative Ears, nose, mouth, throat, and face: negative Respiratory: negative Cardiovascular: negative Gastrointestinal:  negative Genitourinary:negative Integument/breast: negative Hematologic/lymphatic: negative Musculoskeletal:negative Neurological: negative Behavioral/Psych: negative Endocrine: negative Allergic/Immunologic: negative    Objective:  Blood pressure 120/84, pulse 68, weight 120 lb 6.4 oz (54.6 kg), last menstrual period 03/01/2019, unknown if currently breastfeeding.  General:  alert, cooperative and no distress   Breasts:  inspection negative, no nipple discharge or bleeding, no masses or nodularity palpable  Lungs: clear to auscultation bilaterally  Heart:  regular rate and rhythm, S1, S2 normal, no murmur, click, rub or gallop  Abdomen: soft, non-tender; bowel sounds normal; no masses,  no organomegaly   Vulva:  normal  Vagina: vagina positive for white, thick vaginal discharge and dark, reddish-brown mucoid blood  Cervix:  multiparous appearance  Corpus: normal size, contour, position, consistency, mobility, non-tender  Adnexa:  normal adnexa  Rectal Exam: Not performed.        Assessment:  Postpartum care and examination  - Cytology - PAP( [place]) -  Normal postpartum exam. Pap smear at today's visit.   Gestational diabetes mellitus (GDM), postpartum - Not fasting - will reschedule 2 hr GTT  Encounter for initial prescription of contraceptive pills  - Rx for norethindrone (MICRONOR) 0.35 MG tablet   Plan:   Essential components of care per ACOG recommendations:  1.  Mood and well being: Patient with negative depression screening today. Reviewed local resources for support.  - Patient does not use tobacco.   - hx of drug use? No  2. Infant care and feeding:  -Patient currently breastmilk feeding? No If breastmilk feeding discussed return to work and pumping. If needed, patient was provided letter for work to allow for every 2-3 hr pumping breaks, and to be granted a private location to express breastmilk and refrigerated area to store breastmilk. Reviewed  importance of draining breast regularly to support lactation. -Social determinants of health (SDOH) reviewed in EPIC. No concerns  3. Sexuality, contraception and birth spacing - Patient does not want a pregnancy in the next year.  Desired family size is 4 children.  - Reviewed forms of contraception in tiered fashion. Patient desired oral progesterone-only contraceptive today. Rx sent to pharmacy. Advised to take pill at the same time everyday. - Discussed birth spacing of 18 months  4. Sleep and fatigue -Encouraged family/partner/community support of 4 hrs of uninterrupted sleep to help with mood and fatigue  5. Physical Recovery  - Discussed patients delivery and complications - Patient had no laceration, perineal healing reviewed. Patient expressed understanding - Patient has urinary incontinence? No - Patient is safe to resume physical and sexual activity  6.  Health Maintenance - Last pap smear done 01/29/2012 and was normal with negative HPV. No Mammogram  7. No Chronic Disease - PCP follow up  Raelyn Mora, CNM Center for [employer], [facility]

## 2019-12-31 LAB — CYTOLOGY - PAP
Chlamydia: NEGATIVE
Comment: NEGATIVE
Comment: NEGATIVE
Comment: NORMAL
Diagnosis: UNDETERMINED — AB
High risk HPV: NEGATIVE
Neisseria Gonorrhea: NEGATIVE

## 2020-01-03 ENCOUNTER — Telehealth: Payer: Self-pay

## 2020-01-03 NOTE — Telephone Encounter (Signed)
-----   Message from Raelyn Mora, [place] sent at 01/02/2020  1:49 PM EDT ----- Please advise patient of her pap results. According to ASCCP, she will need regular cervical cancer screening.

## 2020-01-03 NOTE — Telephone Encounter (Signed)
-----   Message from Rolitta Dawson, CNM sent at 01/02/2020  1:49 PM EDT ----- °Please advise patient of her pap results. According to ASCCP, she will need regular cervical cancer screening. °

## 2020-01-03 NOTE — Telephone Encounter (Signed)
Called Pt using [employer], no answer, Pt does not have VM set up. Will try later.

## 2020-01-11 ENCOUNTER — Other Ambulatory Visit: Payer: Medicaid Other

## 2020-03-25 NOTE — L&D Delivery Note (Signed)
OB/GYN Faculty Practice Delivery Note  Courtney Clark is a 30 y.o. [id number] s/p NSVD at [redacted]w[redacted]d. She was admitted for SOL.   ROM: 0h 41m with moderately meconium stained fluid GBS Status: Negative  Delivery Date/Time: 10/27/2020 at 1906  Delivery: Called to room and patient was complete and pushing. Head delivered middle occiput anterior. No nuchal cord present. Shoulder and body delivered in usual fashion. Infant with spontaneous cry, placed on mother's abdomen, dried and stimulated. Cord clamped x 2 after 1-minute delay, and cut by father of baby under my direct supervision. Cord blood drawn. Placenta delivered spontaneously with gentle cord traction. Fundus firm with massage and Pitocin. TXA given due to hx of PPH. Labia, perineum, vagina, and cervix were inspected and no lacerations were noted.   Placenta: Intact, 2-vessel cord Complications: None Lacerations: None EBL: 250 mL Analgesia: None  Infant: Viable female  APGARs 9/9  Courtney Field, MD OB Fellow, Faculty Practice [facility], Center for [facility] 10/27/2020 7:32 PM

## 2020-07-03 ENCOUNTER — Ambulatory Visit (INDEPENDENT_AMBULATORY_CARE_PROVIDER_SITE_OTHER): Payer: Medicaid Other | Admitting: Lactation Services

## 2020-07-03 DIAGNOSIS — Z3201 Encounter for pregnancy test, result positive: Secondary | ICD-10-CM | POA: Diagnosis not present

## 2020-07-03 LAB — POCT PREGNANCY, URINE: Preg Test, Ur: POSITIVE — AB

## 2020-07-03 NOTE — Progress Notes (Addendum)
Patient came in and dropped off urine for pregnancy test. Pregnancy Test +.   Called patient at 12:20 pm with assistance of [employer], # JRKB. Was not able to reach patient and voicemail was not set up so not able to leave a message. Called spouse's number. He reports he is not with patient. Asked husband to have wife call the office for results. Call back number given for patient to call the office for results. Will try again to reach patient to call patient this afternoon.   Called patient again at 3:59 pm with assistance of [employer], [place] interpreter is not available at this time. Will need to call again at a later time.   Will to to call patient again tomorrow.   Called patient again on 4/12 at 9:31 am with assistance of [employer], # JRKB. Was able to reach patient.   Patient was informed that her pregnancy test it Positive.   Patient reports she is not taking PNV, will send prescription to Pharmacy at Patients request. She would like sent to [facility].   Patient reports she is feeling well. She is not experiencing any Nausea.   Patient reports her LMP was in January 03, 2020. She was taking OCP's and stopped taking those in March and did not have a period prior to finding out she is pregnant. Dating [place] ordered. [place] scheduled for 4/28 at 11 am, arrive at 10:45 am with full bladder. Reviewed with patient that once [place] is obtained we can get her scheduled for her prenatal visits.   Reviewed with patient that if she experiencing severe abdominal pain she is to go to [facility] at [facility] at [facility]. Patient voiced understanding.   Office number given to call as needed.

## 2020-07-04 MED ORDER — PRENATAL PLUS 27-1 MG PO TABS: 1.0000 | ORAL_TABLET | Freq: Every day | ORAL | 11 refills | Status: DC

## 2020-07-04 NOTE — Progress Notes (Signed)
Chart reviewed for nurse visit. Agree with plan of care.   Venia [place], NP 07/04/2020 1:18 PM

## 2020-07-04 NOTE — Addendum Note (Signed)
Addended by: Ed Blalock on: 07/04/2020 09:55 AM   Modules accepted: Orders

## 2020-07-20 ENCOUNTER — Other Ambulatory Visit: Payer: Self-pay

## 2020-07-20 ENCOUNTER — Ambulatory Visit (HOSPITAL_BASED_OUTPATIENT_CLINIC_OR_DEPARTMENT_OTHER): Payer: Medicaid Other

## 2020-07-20 ENCOUNTER — Ambulatory Visit
Admission: RE | Admit: 2020-07-20 | Discharge: 2020-07-20 | Disposition: A | Discharge status: Home / Self Care | Payer: Medicaid Other | Source: Ambulatory Visit | Attending: Obstetrics and Gynecology | Admitting: Obstetrics and Gynecology

## 2020-07-20 ENCOUNTER — Other Ambulatory Visit: Payer: Self-pay | Admitting: Obstetrics and Gynecology

## 2020-07-20 DIAGNOSIS — Z3492 Encounter for supervision of normal pregnancy, unspecified, second trimester: Secondary | ICD-10-CM | POA: Insufficient documentation

## 2020-07-20 DIAGNOSIS — Z3201 Encounter for pregnancy test, result positive: Secondary | ICD-10-CM | POA: Diagnosis present

## 2020-07-21 ENCOUNTER — Other Ambulatory Visit: Payer: Self-pay | Admitting: *Deleted

## 2020-07-21 DIAGNOSIS — Q270 Congenital absence and hypoplasia of umbilical artery: Secondary | ICD-10-CM

## 2020-07-25 ENCOUNTER — Telehealth: Payer: Self-pay | Admitting: Family Medicine

## 2020-07-25 NOTE — Telephone Encounter (Signed)
Scheduled for next week and sent reminder letter per no voicemail

## 2020-08-03 ENCOUNTER — Encounter: Payer: Self-pay | Admitting: *Deleted

## 2020-08-03 ENCOUNTER — Ambulatory Visit (INDEPENDENT_AMBULATORY_CARE_PROVIDER_SITE_OTHER): Payer: Medicaid Other | Admitting: Obstetrics and Gynecology

## 2020-08-03 ENCOUNTER — Encounter: Payer: Self-pay | Admitting: Obstetrics and Gynecology

## 2020-08-03 ENCOUNTER — Other Ambulatory Visit: Payer: Self-pay

## 2020-08-03 ENCOUNTER — Other Ambulatory Visit (HOSPITAL_COMMUNITY)
Admission: RE | Admit: 2020-08-03 | Discharge: 2020-08-03 | Disposition: A | Discharge status: Home / Self Care | Payer: Medicaid Other | Source: Ambulatory Visit | Attending: Obstetrics and Gynecology | Admitting: Obstetrics and Gynecology

## 2020-08-03 VITALS — BP 122/75 | HR 109

## 2020-08-03 DIAGNOSIS — O09899 Supervision of other high risk pregnancies, unspecified trimester: Secondary | ICD-10-CM | POA: Insufficient documentation

## 2020-08-03 DIAGNOSIS — O09892 Supervision of other high risk pregnancies, second trimester: Secondary | ICD-10-CM

## 2020-08-03 DIAGNOSIS — O09299 Supervision of pregnancy with other poor reproductive or obstetric history, unspecified trimester: Secondary | ICD-10-CM | POA: Diagnosis not present

## 2020-08-03 DIAGNOSIS — R87610 Atypical squamous cells of undetermined significance on cytologic smear of cervix (ASC-US): Secondary | ICD-10-CM

## 2020-08-03 DIAGNOSIS — O358XX0 Maternal care for other (suspected) fetal abnormality and damage, not applicable or unspecified: Secondary | ICD-10-CM | POA: Insufficient documentation

## 2020-08-03 DIAGNOSIS — O24419 Gestational diabetes mellitus in pregnancy, unspecified control: Secondary | ICD-10-CM | POA: Insufficient documentation

## 2020-08-03 DIAGNOSIS — Z8632 Personal history of gestational diabetes: Secondary | ICD-10-CM | POA: Diagnosis not present

## 2020-08-03 DIAGNOSIS — O0990 Supervision of high risk pregnancy, unspecified, unspecified trimester: Secondary | ICD-10-CM | POA: Insufficient documentation

## 2020-08-03 DIAGNOSIS — Z789 Other specified health status: Secondary | ICD-10-CM

## 2020-08-03 DIAGNOSIS — Z3A26 26 weeks gestation of pregnancy: Secondary | ICD-10-CM | POA: Diagnosis not present

## 2020-08-03 DIAGNOSIS — O35BXX0 Maternal care for other (suspected) fetal abnormality and damage, fetal cardiac anomalies, not applicable or unspecified: Secondary | ICD-10-CM

## 2020-08-03 LAB — POCT URINALYSIS DIP (DEVICE)
Bilirubin Urine: NEGATIVE
Glucose, UA: NEGATIVE mg/dL
Hgb urine dipstick: NEGATIVE
Ketones, ur: NEGATIVE mg/dL
Leukocytes,Ua: NEGATIVE
Nitrite: NEGATIVE
Protein, ur: NEGATIVE mg/dL
Specific Gravity, Urine: 1.025 (ref 1.005–1.030)
Urobilinogen, UA: 0.2 mg/dL (ref 0.0–1.0)
pH: 7 (ref 5.0–8.0)

## 2020-08-03 LAB — GLUCOSE, CAPILLARY: Glucose-Capillary: 86 mg/dL (ref 70–99)

## 2020-08-03 NOTE — Progress Notes (Signed)
Declined genetic testing. States has blood pressure cuff at home from last pregnancy. Advised to start taking bp weekly and record on log and  to call us if higher or equal to 140/90.  Offered and accepted [facility] referral. Medicaid home form completed.  Mitra Duling,RN

## 2020-08-03 NOTE — Progress Notes (Signed)
Subjective:    Courtney Clark is a [id number] [redacted]w[redacted]d being seen today for her first obstetrical visit.  Her obstetrical history is significant for hx gestational diabetes, diet controlled, short interval pregnancy. Patient does intend to breast feed. Pregnancy history fully reviewed.  Patient reports no complaints. She was taking POP's and had no idea she was pregnant until about a month ago. Feeling well overall.   Vitals:   08/03/20 1328  BP: 122/75  Pulse: (!) 109    HISTORY: OB History  Gravida Para Term Preterm AB Living  5 4 4  0 0 4  SAB IAB Ectopic Multiple Live Births  0 0 0 0 4    # Outcome Date GA Lbr Len/2nd Weight Sex Delivery Anes PTL Lv  5 Current           4 Term 11/29/19 [redacted]w[redacted]d / 00:23 6 lb 4.9 oz (2.86 kg) M Vag-Spont None  LIV     Birth Comments: WNL, meconium stain,   3 Term 08/12/17 [redacted]w[redacted]d   F Vag-Spont None  LIV     Birth Comments: GDM, anemia, PPH  2 Term 05/21/12 [redacted]w[redacted]d 04:58 / 00:15 6 lb 15 oz (3.147 kg) M Vag-Spont Other  LIV     Birth Comments: wnl  1 Term 02/2010 [redacted]w[redacted]d  7 lb (3.175 kg) M Vag-Spont None  LIV     Birth Comments: NO COMP   Past Medical History:  Diagnosis Date  . Anemia    PP  . History of diet controlled gestational diabetes mellitus (GDM) 08/17/2019  . Language barrier 05/21/2012  . NSVD (normal spontaneous vaginal delivery) 05/21/2012   Past Surgical History:  Procedure Laterality Date  . NO PAST SURGERIES     Family History  Problem Relation Age of Onset  . Heart disease Mother      Exam    Pelvic Exam:    Perineum: No Hemorrhoids   Vulva: normal   Vagina:  normal mucosa   Skin: normal coloration and turgor, no rashes    Neurologic: oriented, normal   Extremities: normal strength, tone, and muscle mass   HEENT PERRLA   Mouth/Teeth mucous membranes moist, pharynx normal without lesions   Neck supple   Cardiovascular: regular rate and rhythm   Respiratory:  appears well, vitals normal, no respiratory distress, acyanotic,  normal RR, ear and throat exam is normal, neck free of mass or lymphadenopathy, chest clear, no wheezing, crepitations, rhonchi, normal symmetric air entry   Abdomen: soft, non-tender; bowel sounds normal; no masses,  no organomegaly   Urinary: urethral meatus normal      Assessment:    Pregnancy: 05/23/2012 Patient Active Problem List   Diagnosis Date Noted  . Labor and delivery, indication for care 11/29/2019  . Late prenatal care in second trimester 11/29/2019  . Gestational diabetes mellitus in pregnancy, diet controlled 09/19/2019  . Anemia in pregnancy, second trimester 08/20/2019  . 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 History of postpartum hemorrhage, currently pregnant 08/17/2019  . Supervision of high risk pregnancy, antepartum 08/17/2019  . Language barrier 05/21/2012        Plan:    Supervision of normal pregnancy  Initial labs drawn. Prenatal vitamins. Problem list reviewed and updated. Genetic Screening discussed and declined  Ultrasound already completed  Follow up asap for 2 hr gtt and again in 2 weeks.   History of gestational diabetes  -needs to return asap for 2 hr. Will obtain a1c today -random CBG today  86  Short Interval Pregnancy -also w two vessel cord and EIF. Declines genetics. Declined fetal echo.    Gita Kudo 08/03/2020

## 2020-08-04 LAB — GC/CHLAMYDIA PROBE AMP (~~LOC~~) NOT AT ARMC
Chlamydia: NEGATIVE
Comment: NEGATIVE
Comment: NORMAL
Neisseria Gonorrhea: NEGATIVE

## 2020-08-05 LAB — CULTURE, OB URINE

## 2020-08-05 LAB — URINE CULTURE, OB REFLEX

## 2020-08-07 ENCOUNTER — Other Ambulatory Visit: Payer: Self-pay

## 2020-08-07 ENCOUNTER — Other Ambulatory Visit: Payer: Medicaid Other

## 2020-08-07 DIAGNOSIS — O0990 Supervision of high risk pregnancy, unspecified, unspecified trimester: Secondary | ICD-10-CM

## 2020-08-08 LAB — CBC
Hematocrit: 32.9 % — ABNORMAL LOW (ref 34.0–46.6)
Hemoglobin: 10.6 g/dL — ABNORMAL LOW (ref 11.1–15.9)
MCH: 22.7 pg — ABNORMAL LOW (ref 26.6–33.0)
MCHC: 32.2 g/dL (ref 31.5–35.7)
MCV: 70 fL — ABNORMAL LOW (ref 79–97)
Platelets: 308 10*3/uL (ref 150–450)
RBC: 4.67 x10E6/uL (ref 3.77–5.28)
RDW: 13.2 % (ref 11.7–15.4)
WBC: 8 10*3/uL (ref 3.4–10.8)

## 2020-08-08 LAB — GLUCOSE TOLERANCE, 2 HOURS W/ 1HR
Glucose, 1 hour: 204 mg/dL — ABNORMAL HIGH (ref 65–179)
Glucose, 2 hour: 126 mg/dL (ref 65–152)
Glucose, Fasting: 76 mg/dL (ref 65–91)

## 2020-08-08 LAB — HIV ANTIBODY (ROUTINE TESTING W REFLEX): HIV Screen 4th Generation wRfx: NONREACTIVE

## 2020-08-08 LAB — RPR: RPR Ser Ql: NONREACTIVE

## 2020-08-08 LAB — RUBELLA SCREEN: Rubella Antibodies, IGG: 3.13 index (ref >0.99)

## 2020-08-08 LAB — HEPATITIS B SURFACE ANTIGEN: Hepatitis B Surface Ag: NEGATIVE

## 2020-08-08 LAB — ABO/RH: Rh Factor: POSITIVE

## 2020-08-08 LAB — HEPATITIS C ANTIBODY: Hep C Virus Ab: <0.1 s/co ratio (ref 0.0–0.9)

## 2020-08-08 LAB — HEMOGLOBIN A1C
Est. average glucose Bld gHb Est-mCnc: 103 mg/dL
Hgb A1c MFr Bld: 5.2 % (ref 4.8–5.6)

## 2020-08-09 ENCOUNTER — Telehealth: Payer: Self-pay

## 2020-08-09 DIAGNOSIS — O24419 Gestational diabetes mellitus in pregnancy, unspecified control: Secondary | ICD-10-CM

## 2020-08-09 MED ORDER — ACCU-CHEK SOFTCLIX LANCETS MISC: 12 refills | Status: DC

## 2020-08-09 MED ORDER — ACCU-CHEK GUIDE VI STRP: ORAL_STRIP | 12 refills | Status: DC

## 2020-08-09 MED ORDER — ACCU-CHEK GUIDE W/DEVICE KIT: 1.0000 | PACK | Freq: Once | 0 refills | Status: AC

## 2020-08-09 NOTE — Telephone Encounter (Addendum)
Opened in error

## 2020-08-09 NOTE — Telephone Encounter (Addendum)
-----   Message from Gita Kudo, MD sent at 08/08/2020  8:48 PM EDT ----- Can someone please notify patient of abnormal glucose results and set her up with diabetes educator/testing strips? Thank you!!   Casper Harrison, MD   Called pt with [employer] interpreter Keo [place]. Pt's husband answered phone and states he is at work in [place] and wife is at home. Requested we call pt. Called pt; no answer and unable to leave VM. Called husband again, request that he notify patient we are attempting to reach her, he agrees. He states that the afternoon is a busy time for the pt and requests we call again in the morning. Plan made with interpreter and husband to call pt tomorrow morning after 9 AM. Interpreter Keo to call the office.   Supplies ordered and diabetes education referral placed, front office notified to schedule appt.

## 2020-08-10 NOTE — Telephone Encounter (Signed)
Called pt with [employer] Courtney Clark. Results given. Pt reports history of GDM, asks if she needs to have education appt. I explained that we feel it's important to have a meeting with educator to make a plan together for this specific pregnancy and time in your life. Instructed pt to pick up supplies. Encouraged pt to begin checking blood glucose if she is comfortable or she can wait until appt. Reviewed all upcoming appts with patient.

## 2020-08-18 ENCOUNTER — Other Ambulatory Visit: Payer: Self-pay | Admitting: Obstetrics

## 2020-08-18 ENCOUNTER — Other Ambulatory Visit: Payer: Self-pay

## 2020-08-18 ENCOUNTER — Encounter: Payer: Self-pay | Admitting: *Deleted

## 2020-08-18 ENCOUNTER — Ambulatory Visit: Payer: Medicaid Other | Admitting: *Deleted

## 2020-08-18 ENCOUNTER — Ambulatory Visit: Payer: Medicaid Other | Attending: Obstetrics

## 2020-08-18 DIAGNOSIS — Z3A28 28 weeks gestation of pregnancy: Secondary | ICD-10-CM | POA: Diagnosis not present

## 2020-08-18 DIAGNOSIS — O09899 Supervision of other high risk pregnancies, unspecified trimester: Secondary | ICD-10-CM | POA: Diagnosis not present

## 2020-08-18 DIAGNOSIS — O09892 Supervision of other high risk pregnancies, second trimester: Secondary | ICD-10-CM

## 2020-08-18 DIAGNOSIS — Q270 Congenital absence and hypoplasia of umbilical artery: Secondary | ICD-10-CM | POA: Diagnosis not present

## 2020-08-18 DIAGNOSIS — O358XX0 Maternal care for other (suspected) fetal abnormality and damage, not applicable or unspecified: Secondary | ICD-10-CM | POA: Diagnosis not present

## 2020-08-18 DIAGNOSIS — O0990 Supervision of high risk pregnancy, unspecified, unspecified trimester: Secondary | ICD-10-CM | POA: Diagnosis not present

## 2020-08-23 ENCOUNTER — Other Ambulatory Visit: Payer: Self-pay

## 2020-08-23 ENCOUNTER — Ambulatory Visit: Payer: Medicaid Other | Admitting: Obstetrics and Gynecology

## 2020-08-23 ENCOUNTER — Ambulatory Visit (INDEPENDENT_AMBULATORY_CARE_PROVIDER_SITE_OTHER): Payer: Medicaid Other | Admitting: Obstetrics and Gynecology

## 2020-08-23 ENCOUNTER — Encounter: Payer: Self-pay | Admitting: Obstetrics and Gynecology

## 2020-08-23 VITALS — BP 110/65 | HR 96 | Wt 130.9 lb

## 2020-08-23 DIAGNOSIS — O24419 Gestational diabetes mellitus in pregnancy, unspecified control: Secondary | ICD-10-CM

## 2020-08-23 DIAGNOSIS — O24410 Gestational diabetes mellitus in pregnancy, diet controlled: Secondary | ICD-10-CM

## 2020-08-23 DIAGNOSIS — O35BXX0 Maternal care for other (suspected) fetal abnormality and damage, fetal cardiac anomalies, not applicable or unspecified: Secondary | ICD-10-CM

## 2020-08-23 DIAGNOSIS — O358XX0 Maternal care for other (suspected) fetal abnormality and damage, not applicable or unspecified: Secondary | ICD-10-CM

## 2020-08-23 DIAGNOSIS — Q270 Congenital absence and hypoplasia of umbilical artery: Secondary | ICD-10-CM | POA: Insufficient documentation

## 2020-08-23 DIAGNOSIS — O09899 Supervision of other high risk pregnancies, unspecified trimester: Secondary | ICD-10-CM

## 2020-08-23 DIAGNOSIS — O99013 Anemia complicating pregnancy, third trimester: Secondary | ICD-10-CM

## 2020-08-23 DIAGNOSIS — Z789 Other specified health status: Secondary | ICD-10-CM

## 2020-08-23 DIAGNOSIS — O0990 Supervision of high risk pregnancy, unspecified, unspecified trimester: Secondary | ICD-10-CM

## 2020-08-23 MED ORDER — FERROUS SULFATE 325 (65 FE) MG PO TABS: 325.0000 mg | ORAL_TABLET | Freq: Every day | ORAL | 2 refills | Status: AC

## 2020-08-23 NOTE — Patient Instructions (Signed)
Gestational Diabetes Mellitus, Diagnosis Gestational diabetes mellitus is a form of diabetes. It can happen when you are pregnant. The diabetes goes away after you give birth. If you do not get treated for this condition, it may cause problems for you or your baby. What are the causes? This condition is caused by changes in your body when you are pregnant. When these happen:  A part of the body called the pancreas does not make enough insulin.  The body cannot use insulin in the right way. Sugars cannot get into cells in your body. The sugars stay in your blood. This leads to high blood sugar.   What increases the risk?  Being older than age 25 when pregnant.  Having someone with diabetes in your family.  Too much body weight.  Having had this condition in the past.  Polycystic ovary syndrome.  Being pregnant with more than one baby. What are the signs or symptoms?  Being thirsty often.  Being hungry often.  Needing to pee more often. How is this treated?  Eat a healthy diet.  Get more exercise.  Check your blood sugar often.  Take insulin and other medicines, if needed.  Work with an expert on this condition, if told. Follow these instructions at home: Learn about your diabetes Ask your doctor:  How often should I check my blood sugar? Where do I get the equipment?  What medicines do I need? When should I take them?  Do I need to meet with an educator?  Who can I call if I have questions?  Where can I find a support group? General instructions  Take medicines only as told by your doctor.  Stay at a healthy weight.  Drink enough fluid to keep your pee pale yellow.  Wear an alert bracelet or carry a card that shows you have this condition.  Keep all follow-up visits. Where to find more information  American Diabetes Association (ADA): diabetes.org  Association of Diabetes Care & Education Specialists (ADCES): diabeteseducator.org  Centers for  Disease Control and Prevention (CDC): cdc.gov  American Pregnancy Association: americanpregnancy.org  U.S. Department of Agriculture MyPlate: myplate.gov Contact a doctor if:  Your blood sugar is at or above 240 mg/dL (13.3 mmol/L).  Your blood sugar is at or above 200 mg/dL (11.1 mmol/L) and you have ketones in your pee.  You have a fever.  You are sick for 2 days or more and you do not get better.  You have either of these problems for more than 6 hours: ? You vomit every time you eat or drink. ? You have watery poop (diarrhea). Get help right away if:  You cannot think clearly.  You are not breathing well.  You have a lot of ketones in your pee.  Your baby seems to move less than normal.  Abnormal fluid or blood starts to come out of your vagina.  You start having contractions before your due date. You may feel your belly tighten.  You have a very bad headache. These symptoms may be an emergency. Get help right away. Call your local emergency services (911 in the [place]  Do not wait to see if the symptoms will go away.  Do not drive yourself to the hospital. Summary  Gestational diabetes is a form of diabetes. It can happen when you are pregnant.  This condition occurs when your body cannot make or use insulin in the right way.  Eat a healthy diet, exercise, and use medicines or insulin   as told by your doctor.  Tell your doctor if your blood sugar is high, you have a fever, or you vomit every time you eat or drink.  Get help right away if you cannot think clearly, you are not breathing well, or your baby seems to move less than normal. This information is not intended to replace advice given to you by your health care provider. Make sure you discuss any questions you have with your health care provider. Document Revised: 08/16/2019 Document Reviewed: 08/16/2019 Elsevier Patient Education  2021 Elsevier Inc.  

## 2020-08-23 NOTE — Progress Notes (Signed)
Please see OB chart. Pt was inadvertently placed as a GYN visit.

## 2020-08-23 NOTE — Progress Notes (Signed)
Subjective:  Courtney Clark is a 30 y.o. G5P4004 at [redacted]w[redacted]d being seen today for ongoing prenatal care.  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  Patient reports no complaints.   .  .   . Denies leaking of fluid.   The following portions of the patient's history were reviewed and updated as appropriate: allergies, current medications, past family history, past medical history, past social history, past surgical history and problem list. Problem list updated.  Objective:  There were no vitals filed for this visit.  Fetal Status:           General:  Alert, oriented and cooperative. Patient is in no acute distress.  Skin: Skin is warm and dry. No rash noted.   Cardiovascular: Normal heart rate noted  Respiratory: Normal respiratory effort, no problems with respiration noted  Abdomen: Soft, gravid, appropriate for gestational age.       Pelvic:  Cervical exam deferred        Extremities: Normal range of motion.     Mental Status: Normal mood and affect. Normal behavior. Normal judgment and thought content.   Urinalysis:      Assessment and Plan:  Pregnancy: G3O7564 at [redacted]w[redacted]d Stable  GDM Dx reviewed with pt. To see DM educator this week Importance of glycemic control to reduce pregnancy related complications reviewed  2 vessel cord Growth scans as per MFM  EIF Stable. Declined fetal ECHO and genetic testing  Language barrier Live interpreter used during today's visit  There are no diagnoses linked to this encounter. Preterm labor  symptoms and general obstetric precautions including but not limited to vaginal bleeding, contractions, leaking of fluid and fetal movement were reviewed in detail with the patient. Please refer to After Visit Summary for other counseling recommendations.  Return in about 2 weeks (around 09/06/2020) for OB visit, face to face, MD only.   Hermina Staggers, MD

## 2020-08-24 ENCOUNTER — Ambulatory Visit: Payer: Medicaid Other | Admitting: Registered"

## 2020-08-24 ENCOUNTER — Encounter: Payer: Medicaid Other | Admitting: Registered"

## 2020-08-24 DIAGNOSIS — O24410 Gestational diabetes mellitus in pregnancy, diet controlled: Secondary | ICD-10-CM | POA: Diagnosis not present

## 2020-08-24 DIAGNOSIS — O24419 Gestational diabetes mellitus in pregnancy, unspecified control: Secondary | ICD-10-CM

## 2020-08-24 NOTE — Progress Notes (Signed)
Interpreter services provided by [employer] from [employer]  Patient was seen on 08/24/20 for Gestational Diabetes self-management. EDD 11/04/20; [redacted]w[redacted]d . Patient states prior history of GDM, education from [facility] 2021 & 2019.  Previous GDM managed with diet/lifestyle 2-hr OGTT: 1 hr 204 A1c: 5.2% 08/03/20  Current medication: ferrous sulfate  Wt Readings from Last 3 Encounters:  08/23/20 130 lb 14.4 oz (59.4 kg)  12/29/19 120 lb 6.4 oz (54.6 kg)  11/29/19 143 lb (64.9 kg)   Diet history obtained. Patient eats variety of all food groups. Beverages include water.    The following learning objectives were met by the patient :   States the definition of Gestational Diabetes  States why dietary management is important in controlling blood glucose  Describes the effects of carbohydrates on blood glucose levels  Demonstrates ability to create a balanced meal plan  Demonstrates carbohydrate counting   States when to check blood glucose levels  Demonstrates proper blood glucose monitoring techniques  States the effect of stress and exercise on blood glucose levels  States the importance of limiting caffeine and abstaining from alcohol and smoking  Plan:  Aim for 3 Carbohydrate Choices per meal (45 grams) +/- 1 either way  Aim for 1-2 Carbohydrate Choices per snack Begin reading food labels for Total Carbohydrate of foods If OK with your MD, consider  increasing your activity level by walking, Arm Chair Exercises or other activity daily as tolerated Begin checking Blood Glucose before breakfast and 2 hours after first bite of breakfast, lunch and dinner as directed by MD  Bring Log Book/Sheet and meter to every medical appointment  Take medication if directed by MD  Rx order placed for  Accu-chek Guide strips and Softclix lancets  Patient already has a meter from previous pregnancy. Pt state she was told to wait until this visit to start checking.   Patient instructed to  monitor glucose levels: FBS: 60 - 95 mg/dl 2 hour: <120 mg/dl  Patient received the following handouts:  Nutrition Diabetes and Pregnancy  Carbohydrate Counting List  Blood glucose Log Sheet  Patient will be seen for follow-up as needed.  

## 2020-09-04 ENCOUNTER — Other Ambulatory Visit: Payer: Self-pay

## 2020-09-06 ENCOUNTER — Ambulatory Visit (INDEPENDENT_AMBULATORY_CARE_PROVIDER_SITE_OTHER): Payer: Medicaid Other | Admitting: Family Medicine

## 2020-09-06 ENCOUNTER — Other Ambulatory Visit: Payer: Self-pay

## 2020-09-06 ENCOUNTER — Encounter: Payer: Self-pay | Admitting: Family Medicine

## 2020-09-06 VITALS — BP 100/66 | HR 98 | Wt 131.3 lb

## 2020-09-06 DIAGNOSIS — O0990 Supervision of high risk pregnancy, unspecified, unspecified trimester: Secondary | ICD-10-CM

## 2020-09-06 DIAGNOSIS — Z23 Encounter for immunization: Secondary | ICD-10-CM | POA: Diagnosis not present

## 2020-09-06 DIAGNOSIS — O09299 Supervision of pregnancy with other poor reproductive or obstetric history, unspecified trimester: Secondary | ICD-10-CM

## 2020-09-06 DIAGNOSIS — O358XX0 Maternal care for other (suspected) fetal abnormality and damage, not applicable or unspecified: Secondary | ICD-10-CM

## 2020-09-06 DIAGNOSIS — Q270 Congenital absence and hypoplasia of umbilical artery: Secondary | ICD-10-CM

## 2020-09-06 DIAGNOSIS — O09899 Supervision of other high risk pregnancies, unspecified trimester: Secondary | ICD-10-CM

## 2020-09-06 DIAGNOSIS — O24410 Gestational diabetes mellitus in pregnancy, diet controlled: Secondary | ICD-10-CM

## 2020-09-06 DIAGNOSIS — O35BXX0 Maternal care for other (suspected) fetal abnormality and damage, fetal cardiac anomalies, not applicable or unspecified: Secondary | ICD-10-CM

## 2020-09-06 DIAGNOSIS — Z789 Other specified health status: Secondary | ICD-10-CM

## 2020-09-06 NOTE — Progress Notes (Signed)
   Subjective:  Courtney Clark is a 30 y.o. G5P4004 at [redacted]w[redacted]d being seen today for ongoing prenatal care.  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  Patient reports no complaints.  Contractions: Not present. Vag. Bleeding: None.  Movement: Present. Denies leaking of fluid.   The following portions of the patient's history were reviewed and updated as appropriate: allergies, current medications, past family history, past medical history, past social history, past surgical history and problem list. Problem list updated.  Objective:   Vitals:   09/06/20 0909  BP: 100/66  Pulse: 98  Weight: 131 lb 4.8 oz (59.6 kg)    Fetal Status: Fetal Heart Rate (bpm): 152   Movement: Present     General:  Alert, oriented and cooperative. Patient is in no acute distress.  Skin: Skin is warm and dry. No rash noted.   Cardiovascular: Normal heart rate noted  Respiratory: Normal respiratory effort, no problems with respiration noted  Abdomen: Soft, gravid, appropriate for gestational age. Pain/Pressure: Absent     Pelvic: Vag. Bleeding: None     Cervical exam deferred        Extremities: Normal range of motion.  Edema: None  Mental Status: Normal mood and affect. Normal behavior. Normal judgment and thought content.   Urinalysis:      Assessment and Plan:  Pregnancy: G5P4004 at [redacted]w[redacted]d  1. Supervision of high risk pregnancy, antepartum BP and FHR normal TDaP today Discussed contraception at length, she does not want more children. Afraid of Nexplanon due to  sister's bad experience, did not like IUD previously, reviewed BTL at length but she is afraid of that as well. Also discussed vasectomy for her partner. She will discuss with him further.   2. Diet controlled gestational diabetes mellitus (GDM), antepartum Excellent control, only one value out of range Following w MFM for growth scans  3. Two vessel umbilical cord Follows w MFM  4. Language barrier [place]  5. History of postpartum hemorrhage, currently pregnant   6. Echogenic focus of heart of fetus affecting antepartum care of mother, single or unspecified fetus   7. Short interval between pregnancies affecting pregnancy, antepartum Last delivery 2021  Preterm labor symptoms and general obstetric precautions including but not limited to vaginal bleeding, contractions, leaking of fluid and fetal movement were reviewed in detail with the patient. Please refer to After Visit Summary for other counseling recommendations.  Return in 2 weeks (on 09/20/2020) for [facility], ob visit.   Venora Maples, MD

## 2020-09-06 NOTE — Progress Notes (Signed)
Pt has Paper Glucose Log 

## 2020-09-15 ENCOUNTER — Other Ambulatory Visit: Payer: Self-pay

## 2020-09-15 ENCOUNTER — Ambulatory Visit: Payer: Medicaid Other | Attending: Obstetrics

## 2020-09-15 ENCOUNTER — Encounter: Payer: Self-pay | Admitting: *Deleted

## 2020-09-15 ENCOUNTER — Ambulatory Visit: Payer: Medicaid Other | Admitting: *Deleted

## 2020-09-15 VITALS — BP 112/58 | HR 85

## 2020-09-15 DIAGNOSIS — O0990 Supervision of high risk pregnancy, unspecified, unspecified trimester: Secondary | ICD-10-CM

## 2020-09-15 DIAGNOSIS — O09893 Supervision of other high risk pregnancies, third trimester: Secondary | ICD-10-CM

## 2020-09-15 DIAGNOSIS — O09899 Supervision of other high risk pregnancies, unspecified trimester: Secondary | ICD-10-CM

## 2020-09-15 DIAGNOSIS — O368920 Maternal care for other specified fetal problems, second trimester, not applicable or unspecified: Secondary | ICD-10-CM | POA: Diagnosis not present

## 2020-09-15 DIAGNOSIS — Z3A32 32 weeks gestation of pregnancy: Secondary | ICD-10-CM

## 2020-09-20 ENCOUNTER — Other Ambulatory Visit: Payer: Self-pay

## 2020-09-20 ENCOUNTER — Ambulatory Visit (INDEPENDENT_AMBULATORY_CARE_PROVIDER_SITE_OTHER): Payer: Medicaid Other | Admitting: Family Medicine

## 2020-09-20 VITALS — BP 114/63 | HR 82 | Wt 133.8 lb

## 2020-09-20 DIAGNOSIS — Q270 Congenital absence and hypoplasia of umbilical artery: Secondary | ICD-10-CM

## 2020-09-20 DIAGNOSIS — Z789 Other specified health status: Secondary | ICD-10-CM

## 2020-09-20 DIAGNOSIS — O24410 Gestational diabetes mellitus in pregnancy, diet controlled: Secondary | ICD-10-CM

## 2020-09-20 DIAGNOSIS — O0990 Supervision of high risk pregnancy, unspecified, unspecified trimester: Secondary | ICD-10-CM

## 2020-09-20 DIAGNOSIS — O358XX0 Maternal care for other (suspected) fetal abnormality and damage, not applicable or unspecified: Secondary | ICD-10-CM

## 2020-09-20 DIAGNOSIS — Z3A33 33 weeks gestation of pregnancy: Secondary | ICD-10-CM

## 2020-09-20 DIAGNOSIS — O35BXX0 Maternal care for other (suspected) fetal abnormality and damage, fetal cardiac anomalies, not applicable or unspecified: Secondary | ICD-10-CM

## 2020-09-20 NOTE — Progress Notes (Signed)
   PRENATAL VISIT NOTE  Subjective:  Courtney Clark is a 30 y.o. G5P4004 at [redacted]w[redacted]d being seen today for ongoing prenatal care.  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  Patient reports no complaints.  Contractions: Not present. Vag. Bleeding: None.  Movement: Present. Denies leaking of fluid.   The following portions of the patient's history were reviewed and updated as appropriate: allergies, current medications, past family history, past medical history, past social history, past surgical history and problem list.   Objective:   Vitals:   09/20/20 0917  BP: 114/63  Pulse: 82  Weight: 133 lb 12.8 oz (60.7 kg)    Fetal Status: Fetal Heart Rate (bpm): 141   Movement: Present     General:  Alert, oriented and cooperative. Patient is in no acute distress.  Skin: Skin is warm and dry. No rash noted.   Cardiovascular: Normal heart rate noted  Respiratory: Normal respiratory effort, no problems with respiration noted  Abdomen: Soft, gravid, appropriate for gestational age.  Pain/Pressure: Absent     Pelvic: Cervical exam deferred        Extremities: Normal range of motion.  Edema: None  Mental Status: Normal mood and affect. Normal behavior. Normal judgment and thought content.   Assessment and Plan:  Pregnancy: G5P4004 at [redacted]w[redacted]d 1. [redacted] weeks gestation of pregnancy   2. Supervision of high risk pregnancy, antepartum FHT and FH normal - [place] MFM OB FOLLOW UP; Future  3. Two vessel umbilical cord Normal growth - [place] MFM OB FOLLOW UP;  Future  4. Diet controlled gestational diabetes mellitus (GDM), antepartum MOstly controlled - 3-4 elevated postprandial over past 2 weeks - [place] MFM OB FOLLOW UP; Future  5. Echogenic focus of heart of fetus affecting antepartum care of mother, single or unspecified fetus  6. Language barrier Interpreter used  Preterm labor symptoms and general obstetric precautions including but not limited to vaginal bleeding, contractions, leaking of fluid and fetal movement were reviewed in detail with the patient. Please refer to After Visit Summary for other counseling recommendations.   No follow-ups on file.  No future appointments.  Rhona Raider Raffaella Edison, DO   .Doreatha Massed

## 2020-10-04 ENCOUNTER — Ambulatory Visit (INDEPENDENT_AMBULATORY_CARE_PROVIDER_SITE_OTHER): Payer: Medicaid Other | Admitting: Family Medicine

## 2020-10-04 ENCOUNTER — Other Ambulatory Visit: Payer: Self-pay

## 2020-10-04 VITALS — BP 115/68 | HR 94 | Wt 134.3 lb

## 2020-10-04 DIAGNOSIS — O24410 Gestational diabetes mellitus in pregnancy, diet controlled: Secondary | ICD-10-CM

## 2020-10-04 DIAGNOSIS — O358XX0 Maternal care for other (suspected) fetal abnormality and damage, not applicable or unspecified: Secondary | ICD-10-CM

## 2020-10-04 DIAGNOSIS — Q270 Congenital absence and hypoplasia of umbilical artery: Secondary | ICD-10-CM

## 2020-10-04 DIAGNOSIS — O09299 Supervision of pregnancy with other poor reproductive or obstetric history, unspecified trimester: Secondary | ICD-10-CM

## 2020-10-04 DIAGNOSIS — O35BXX0 Maternal care for other (suspected) fetal abnormality and damage, fetal cardiac anomalies, not applicable or unspecified: Secondary | ICD-10-CM

## 2020-10-04 DIAGNOSIS — O09899 Supervision of other high risk pregnancies, unspecified trimester: Secondary | ICD-10-CM

## 2020-10-04 DIAGNOSIS — Z789 Other specified health status: Secondary | ICD-10-CM

## 2020-10-04 DIAGNOSIS — O0990 Supervision of high risk pregnancy, unspecified, unspecified trimester: Secondary | ICD-10-CM

## 2020-10-04 LAB — POCT URINALYSIS DIP (DEVICE)
Bilirubin Urine: NEGATIVE
Glucose, UA: NEGATIVE mg/dL
Hgb urine dipstick: NEGATIVE
Ketones, ur: NEGATIVE mg/dL
Leukocytes,Ua: NEGATIVE
Nitrite: NEGATIVE
Protein, ur: NEGATIVE mg/dL
Specific Gravity, Urine: 1.02 (ref 1.005–1.030)
Urobilinogen, UA: 0.2 mg/dL (ref 0.0–1.0)
pH: 7 (ref 5.0–8.0)

## 2020-10-04 NOTE — Progress Notes (Signed)
   PRENATAL VISIT NOTE  Subjective:  Courtney Clark is a 30 y.o. G5P4004 at [redacted]w[redacted]d being seen today for ongoing prenatal care.  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  Patient reports no complaints.  Contractions: Not present. Vag. Bleeding: None.  Movement: Present. Denies leaking of fluid.   The following portions of the patient's history were reviewed and updated as appropriate: allergies, current medications, past family history, past medical history, past social history, past surgical history and problem list.   Objective:   Vitals:   10/04/20 0822  BP: 115/68  Pulse: 94  Weight: 134 lb 4.8 oz (60.9 kg)    Fetal Status: Fetal Heart Rate (bpm): 140   Movement: Present     General:  Alert, oriented and cooperative. Patient is in no acute distress.  Skin: Skin is warm and dry. No rash noted.   Cardiovascular: Normal heart rate noted  Respiratory: Normal respiratory effort, no problems with respiration noted  Abdomen: Soft, gravid, appropriate for gestational age.  Pain/Pressure: Absent     Pelvic: Cervical exam deferred        Extremities: Normal range of motion.  Edema: None  Mental Status: Normal mood and affect. Normal behavior. Normal judgment and thought content.   Assessment and Plan:  Pregnancy: G5P4004 at [redacted]w[redacted]d 1. Supervision of high risk pregnancy, antepartum FHT and FH normal  2. Diet controlled gestational diabetes mellitus (GDM), antepartum Excellent control [place] for growth  3. Short interval between pregnancies  affecting pregnancy, antepartum  4. Echogenic focus of heart of fetus affecting antepartum care of mother, single or unspecified fetus  5. Two vessel umbilical cord Normal growth  6. History of postpartum hemorrhage, currently pregnant Aggressive management of second stage  7. Language barrier Interpreter used  Preterm labor symptoms and general obstetric precautions including but not limited to vaginal bleeding, contractions, leaking of fluid and fetal movement were reviewed in detail with the patient. Please refer to After Visit Summary for other counseling recommendations.   No follow-ups on file.  No future appointments.  Levie Heritage, DO

## 2020-10-10 ENCOUNTER — Other Ambulatory Visit (HOSPITAL_COMMUNITY)
Admission: RE | Admit: 2020-10-10 | Discharge: 2020-10-10 | Disposition: A | Discharge status: Home / Self Care | Payer: Medicaid Other | Source: Ambulatory Visit | Attending: Family Medicine | Admitting: Family Medicine

## 2020-10-10 ENCOUNTER — Ambulatory Visit: Payer: Medicaid Other | Attending: Family Medicine

## 2020-10-10 ENCOUNTER — Encounter: Payer: Self-pay | Admitting: *Deleted

## 2020-10-10 ENCOUNTER — Other Ambulatory Visit: Payer: Self-pay

## 2020-10-10 ENCOUNTER — Encounter: Payer: Self-pay | Admitting: Family Medicine

## 2020-10-10 ENCOUNTER — Ambulatory Visit: Payer: Medicaid Other | Admitting: *Deleted

## 2020-10-10 ENCOUNTER — Ambulatory Visit (INDEPENDENT_AMBULATORY_CARE_PROVIDER_SITE_OTHER): Payer: Medicaid Other | Admitting: Family Medicine

## 2020-10-10 VITALS — BP 122/65 | HR 102 | Wt 135.9 lb

## 2020-10-10 VITALS — BP 121/67 | HR 79

## 2020-10-10 DIAGNOSIS — Q270 Congenital absence and hypoplasia of umbilical artery: Secondary | ICD-10-CM | POA: Diagnosis not present

## 2020-10-10 DIAGNOSIS — O0990 Supervision of high risk pregnancy, unspecified, unspecified trimester: Secondary | ICD-10-CM

## 2020-10-10 DIAGNOSIS — O09299 Supervision of pregnancy with other poor reproductive or obstetric history, unspecified trimester: Secondary | ICD-10-CM

## 2020-10-10 DIAGNOSIS — O24410 Gestational diabetes mellitus in pregnancy, diet controlled: Secondary | ICD-10-CM

## 2020-10-10 DIAGNOSIS — Z789 Other specified health status: Secondary | ICD-10-CM

## 2020-10-10 DIAGNOSIS — O09899 Supervision of other high risk pregnancies, unspecified trimester: Secondary | ICD-10-CM | POA: Insufficient documentation

## 2020-10-10 NOTE — Progress Notes (Signed)
   Subjective:  Courtney Clark is a 30 y.o. G5P4004 at [redacted]w[redacted]d being seen today for ongoing prenatal care.  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  Patient reports no complaints.  Contractions: Not present. Vag. Bleeding: None.  Movement: Present. Denies leaking of fluid.   The following portions of the patient's history were reviewed and updated as appropriate: allergies, current medications, past family history, past medical history, past social history, past surgical history and problem list. Problem list updated.  Objective:   Vitals:   10/10/20 1325  BP: 122/65  Pulse: (!) 102  Weight: 135 lb 14.4 oz (61.6 kg)    Fetal Status: Fetal Heart Rate (bpm): 144   Movement: Present     General:  Alert, oriented and cooperative. Patient is in no acute distress.  Skin: Skin is warm and dry. No rash noted.   Cardiovascular: Normal heart rate noted  Respiratory: Normal respiratory effort, no problems with respiration noted  Abdomen: Soft, gravid, appropriate for gestational age. Pain/Pressure: Absent     Pelvic: Vag. Bleeding: None     Cervical exam deferred        Extremities: Normal range of motion.  Edema: None  Mental Status: Normal mood and affect. Normal behavior. Normal judgment and thought content.   Urinalysis:      Assessment and Plan:  Pregnancy: G5P4004 at [redacted]w[redacted]d  1. Supervision of high risk pregnancy, antepartum BP and FHR normal Swabs today  2. Diet controlled gestational diabetes mellitus (GDM), antepartum Sugars 100% at  goal Most recent growth [place] normal, has follow up scheduled today Discuss IOL next visit  3. Two vessel umbilical cord Following w MFM Normal growth today  4. Language barrier [place]  5. History of postpartum hemorrhage, currently pregnant   Preterm labor symptoms and general obstetric precautions including but not limited to vaginal bleeding, contractions, leaking of fluid and fetal movement were reviewed in detail with the patient. Please refer to After Visit Summary for other counseling recommendations.  Return in 1 week (on 10/17/2020) for [facility], ob visit.   Venora Maples, MD

## 2020-10-11 LAB — GC/CHLAMYDIA PROBE AMP (~~LOC~~) NOT AT ARMC
Chlamydia: NEGATIVE
Comment: NEGATIVE
Comment: NORMAL
Neisseria Gonorrhea: NEGATIVE

## 2020-10-14 LAB — CULTURE, BETA STREP (GROUP B ONLY): Strep Gp B Culture: NEGATIVE

## 2020-10-23 ENCOUNTER — Ambulatory Visit (INDEPENDENT_AMBULATORY_CARE_PROVIDER_SITE_OTHER): Payer: Medicaid Other | Admitting: Family Medicine

## 2020-10-23 ENCOUNTER — Other Ambulatory Visit: Payer: Self-pay

## 2020-10-23 VITALS — BP 111/72 | HR 84 | Wt 137.3 lb

## 2020-10-23 DIAGNOSIS — Z789 Other specified health status: Secondary | ICD-10-CM

## 2020-10-23 DIAGNOSIS — O09299 Supervision of pregnancy with other poor reproductive or obstetric history, unspecified trimester: Secondary | ICD-10-CM

## 2020-10-23 DIAGNOSIS — O0990 Supervision of high risk pregnancy, unspecified, unspecified trimester: Secondary | ICD-10-CM

## 2020-10-23 DIAGNOSIS — O24410 Gestational diabetes mellitus in pregnancy, diet controlled: Secondary | ICD-10-CM

## 2020-10-23 NOTE — Patient Instructions (Signed)

## 2020-10-24 ENCOUNTER — Other Ambulatory Visit: Payer: Self-pay | Admitting: Advanced Practice Midwife

## 2020-10-24 NOTE — Progress Notes (Signed)
   PRENATAL VISIT NOTE  Subjective:  Courtney Clark is a 30 y.o. G5P4004 at [redacted]w[redacted]d being seen today for ongoing prenatal care.  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  Patient reports no complaints.  Contractions: Irritability. Vag. Bleeding: None.  Movement: Present. Denies leaking of fluid.   The following portions of the patient's history were reviewed and updated as appropriate: allergies, current medications, past family history, past medical history, past social history, past surgical history and problem list.   Objective:   Vitals:   10/23/20 1033  BP: 111/72  Pulse: 84  Weight: 137 lb 4.8 oz (62.3 kg)    Fetal Status: Fetal Heart Rate (bpm): 153lbs   Movement: Present     General:  Alert, oriented and cooperative. Patient is in no acute distress.  Skin: Skin is warm and dry. No rash noted.   Cardiovascular: Normal heart rate noted  Respiratory: Normal respiratory effort, no problems with respiration noted  Abdomen: Soft, gravid, appropriate for gestational age.  Pain/Pressure: Present     Pelvic: Cervical exam deferred        Extremities: Normal range of motion.  Edema: None  Mental Status: Normal mood and affect. Normal behavior. Normal judgment and thought content.   Assessment and Plan:  Pregnancy: G5P4004 at [redacted]w[redacted]d 1. Language barrier Phone Rhade interpreter used  2. Supervision of high risk pregnancy, antepartum GBS negative  3. Diet controlled gestational diabetes mellitus (GDM), antepartum See log--appear normal. NO  meter. U/s growth at 84%, will plan IOL at 39 wks.    4. History of postpartum hemorrhage, currently pregnant Consider TXA at delivery  Preterm labor symptoms and general obstetric precautions including but not limited to vaginal bleeding, contractions, leaking of fluid and fetal movement were reviewed in detail with the patient. Please refer to After Visit Summary for other counseling recommendations.   Return in 1 week (on 10/30/2020).  Future Appointments  Date Time Provider Department Center  10/28/2020  8:55 AM [facility] SCHED ROOM [facility] None  11/01/2020  9:35 AM [place] Bing, MD [facility]    Reva Bores, MD

## 2020-10-27 ENCOUNTER — Telehealth: Payer: Self-pay | Admitting: *Deleted

## 2020-10-27 ENCOUNTER — Inpatient Hospital Stay (HOSPITAL_COMMUNITY)
Admission: AD | Admit: 2020-10-27 | Discharge: 2020-10-29 | DRG: 806 | Disposition: A | Discharge status: Home / Self Care | Payer: Medicaid Other | Attending: Obstetrics and Gynecology | Admitting: Obstetrics and Gynecology

## 2020-10-27 ENCOUNTER — Other Ambulatory Visit: Payer: Self-pay

## 2020-10-27 ENCOUNTER — Encounter (HOSPITAL_COMMUNITY): Payer: Self-pay | Admitting: Family Medicine

## 2020-10-27 DIAGNOSIS — Z8759 Personal history of other complications of pregnancy, childbirth and the puerperium: Secondary | ICD-10-CM

## 2020-10-27 DIAGNOSIS — Q270 Congenital absence and hypoplasia of umbilical artery: Secondary | ICD-10-CM

## 2020-10-27 DIAGNOSIS — O26893 Other specified pregnancy related conditions, third trimester: Secondary | ICD-10-CM | POA: Diagnosis not present

## 2020-10-27 DIAGNOSIS — O358XX0 Maternal care for other (suspected) fetal abnormality and damage, not applicable or unspecified: Secondary | ICD-10-CM | POA: Diagnosis present

## 2020-10-27 DIAGNOSIS — O09899 Supervision of other high risk pregnancies, unspecified trimester: Secondary | ICD-10-CM

## 2020-10-27 DIAGNOSIS — Z789 Other specified health status: Secondary | ICD-10-CM | POA: Diagnosis present

## 2020-10-27 DIAGNOSIS — Z20822 Contact with and (suspected) exposure to covid-19: Secondary | ICD-10-CM | POA: Diagnosis not present

## 2020-10-27 DIAGNOSIS — O35BXX0 Maternal care for other (suspected) fetal abnormality and damage, fetal cardiac anomalies, not applicable or unspecified: Secondary | ICD-10-CM | POA: Diagnosis present

## 2020-10-27 DIAGNOSIS — O24410 Gestational diabetes mellitus in pregnancy, diet controlled: Secondary | ICD-10-CM

## 2020-10-27 DIAGNOSIS — Z3A38 38 weeks gestation of pregnancy: Secondary | ICD-10-CM | POA: Diagnosis not present

## 2020-10-27 DIAGNOSIS — O24420 Gestational diabetes mellitus in childbirth, diet controlled: Secondary | ICD-10-CM | POA: Diagnosis not present

## 2020-10-27 HISTORY — DX: Gestational diabetes mellitus in pregnancy, diet controlled: O24.410

## 2020-10-27 LAB — CBC
HCT: 36.9 % (ref 36.0–46.0)
HCT: 37.5 % (ref 36.0–46.0)
Hemoglobin: 11.9 g/dL — ABNORMAL LOW (ref 12.0–15.0)
Hemoglobin: 12.3 g/dL (ref 12.0–15.0)
MCH: 23 pg — ABNORMAL LOW (ref 26.0–34.0)
MCH: 23.2 pg — ABNORMAL LOW (ref 26.0–34.0)
MCHC: 32.2 g/dL (ref 30.0–36.0)
MCHC: 32.8 g/dL (ref 30.0–36.0)
MCV: 71.4 fL — ABNORMAL LOW (ref 80.0–100.0)
MCV: 70.8 fL — ABNORMAL LOW (ref 80.0–100.0)
Platelets: 227 10*3/uL (ref 150–400)
Platelets: 209 10*3/uL (ref 150–400)
RBC: 5.17 MIL/uL — ABNORMAL HIGH (ref 3.87–5.11)
RBC: 5.3 MIL/uL — ABNORMAL HIGH (ref 3.87–5.11)
RDW: 17.4 % — ABNORMAL HIGH (ref 11.5–15.5)
RDW: 17.5 % — ABNORMAL HIGH (ref 11.5–15.5)
WBC: 11.4 10*3/uL — ABNORMAL HIGH (ref 4.0–10.5)
WBC: 17.5 10*3/uL — ABNORMAL HIGH (ref 4.0–10.5)
nRBC: 0 % (ref 0.0–0.2)
nRBC: 0 % (ref 0.0–0.2)

## 2020-10-27 LAB — RESP PANEL BY RT-PCR (FLU A&B, COVID) ARPGX2
Influenza A by PCR: NEGATIVE
Influenza B by PCR: NEGATIVE
SARS Coronavirus 2 by RT PCR: NEGATIVE

## 2020-10-27 MED ORDER — TRANEXAMIC ACID-NACL 1000-0.7 MG/100ML-% IV SOLN
1000.0000 mg | INTRAVENOUS | Status: AC
  Administered 2020-10-27: 1000 mg via INTRAVENOUS
  Filled 2020-10-27: qty 100

## 2020-10-27 MED ORDER — DIPHENHYDRAMINE HCL 25 MG PO CAPS: 25.0000 mg | ORAL_CAPSULE | Freq: Four times a day (QID) | ORAL | Status: DC | PRN

## 2020-10-27 MED ORDER — OXYTOCIN-SODIUM CHLORIDE 30-0.9 UT/500ML-% IV SOLN
INTRAVENOUS | Status: AC
  Filled 2020-10-27: qty 500

## 2020-10-27 MED ORDER — LACTATED RINGERS IV SOLN: INTRAVENOUS | Status: DC

## 2020-10-27 MED ORDER — ONDANSETRON HCL 4 MG/2ML IJ SOLN: 4.0000 mg | Freq: Four times a day (QID) | INTRAMUSCULAR | Status: DC | PRN

## 2020-10-27 MED ORDER — ONDANSETRON HCL 4 MG PO TABS: 4.0000 mg | ORAL_TABLET | ORAL | Status: DC | PRN

## 2020-10-27 MED ORDER — LIDOCAINE HCL (PF) 1 % IJ SOLN: 30.0000 mL | INTRAMUSCULAR | Status: DC | PRN

## 2020-10-27 MED ORDER — FLEET ENEMA 7-19 GM/118ML RE ENEM: 1.0000 | ENEMA | RECTAL | Status: DC | PRN

## 2020-10-27 MED ORDER — OXYCODONE-ACETAMINOPHEN 5-325 MG PO TABS
1.0000 | ORAL_TABLET | ORAL | Status: DC | PRN
Start: 2020-10-27 — End: 2020-10-27

## 2020-10-27 MED ORDER — COCONUT OIL OIL: 1.0000 "application " | TOPICAL_OIL | Status: DC | PRN

## 2020-10-27 MED ORDER — DIBUCAINE (PERIANAL) 1 % EX OINT: 1.0000 "application " | TOPICAL_OINTMENT | CUTANEOUS | Status: DC | PRN

## 2020-10-27 MED ORDER — WITCH HAZEL-GLYCERIN EX PADS: 1.0000 "application " | MEDICATED_PAD | CUTANEOUS | Status: DC | PRN

## 2020-10-27 MED ORDER — SODIUM CHLORIDE 0.9 % IV SOLN: 2.5000 [IU]/h | Freq: Once | INTRAVENOUS | Status: DC

## 2020-10-27 MED ORDER — BENZOCAINE-MENTHOL 20-0.5 % EX AERO
1.0000 "application " | INHALATION_SPRAY | CUTANEOUS | Status: DC | PRN
  Administered 2020-10-27: 1 via TOPICAL
  Filled 2020-10-27: qty 56

## 2020-10-27 MED ORDER — OXYTOCIN-SODIUM CHLORIDE 30-0.9 UT/500ML-% IV SOLN
2.5000 [IU]/h | INTRAVENOUS | Status: DC
  Administered 2020-10-27: 2.5 [IU]/h via INTRAVENOUS

## 2020-10-27 MED ORDER — OXYCODONE-ACETAMINOPHEN 5-325 MG PO TABS: 2.0000 | ORAL_TABLET | ORAL | Status: DC | PRN

## 2020-10-27 MED ORDER — ACETAMINOPHEN 325 MG PO TABS: 650.0000 mg | ORAL_TABLET | ORAL | Status: DC | PRN

## 2020-10-27 MED ORDER — MEASLES, MUMPS & RUBELLA VAC IJ SOLR: 0.5000 mL | Freq: Once | INTRAMUSCULAR | Status: DC

## 2020-10-27 MED ORDER — LACTATED RINGERS IV SOLN: 500.0000 mL | INTRAVENOUS | Status: DC | PRN

## 2020-10-27 MED ORDER — SOD CITRATE-CITRIC ACID 500-334 MG/5ML PO SOLN: 30.0000 mL | ORAL | Status: DC | PRN

## 2020-10-27 MED ORDER — ACETAMINOPHEN 325 MG PO TABS
650.0000 mg | ORAL_TABLET | ORAL | Status: DC | PRN
  Administered 2020-10-27 – 2020-10-28 (×3): 650 mg via ORAL
  Filled 2020-10-27 (×3): qty 2

## 2020-10-27 MED ORDER — OXYTOCIN-SODIUM CHLORIDE 30-0.9 UT/500ML-% IV SOLN
2.5000 [IU]/h | INTRAVENOUS | Status: DC
  Administered 2020-10-27: 2.5 [IU]/h via INTRAVENOUS
  Filled 2020-10-27 (×2): qty 500

## 2020-10-27 MED ORDER — OXYTOCIN BOLUS FROM INFUSION
333.0000 mL | Freq: Once | INTRAVENOUS | Status: AC
Start: 2020-10-27 — End: 2020-10-27
  Administered 2020-10-27: 333 mL via INTRAVENOUS

## 2020-10-27 MED ORDER — OXYTOCIN BOLUS FROM INFUSION: 333.0000 mL | Freq: Once | INTRAVENOUS | Status: DC

## 2020-10-27 MED ORDER — SENNOSIDES-DOCUSATE SODIUM 8.6-50 MG PO TABS
2.0000 | ORAL_TABLET | Freq: Every day | ORAL | Status: DC
  Administered 2020-10-28 – 2020-10-29 (×2): 2 via ORAL
  Filled 2020-10-27 (×2): qty 2

## 2020-10-27 MED ORDER — PRENATAL MULTIVITAMIN CH
1.0000 | ORAL_TABLET | Freq: Every day | ORAL | Status: DC
  Administered 2020-10-28 – 2020-10-29 (×2): 1 via ORAL
  Filled 2020-10-27 (×2): qty 1

## 2020-10-27 MED ORDER — METHYLERGONOVINE MALEATE 0.2 MG/ML IJ SOLN
INTRAMUSCULAR | Status: AC
  Administered 2020-10-27: 0.2 mg
  Filled 2020-10-27: qty 1

## 2020-10-27 MED ORDER — IBUPROFEN 600 MG PO TABS
600.0000 mg | ORAL_TABLET | Freq: Four times a day (QID) | ORAL | Status: DC
  Administered 2020-10-27 – 2020-10-29 (×7): 600 mg via ORAL
  Filled 2020-10-27 (×7): qty 1

## 2020-10-27 MED ORDER — ZOLPIDEM TARTRATE 5 MG PO TABS: 5.0000 mg | ORAL_TABLET | Freq: Every evening | ORAL | Status: DC | PRN

## 2020-10-27 MED ORDER — METHYLERGONOVINE MALEATE 0.2 MG/ML IJ SOLN
0.2000 mg | Freq: Once | INTRAMUSCULAR | Status: DC
  Filled 2020-10-27: qty 1

## 2020-10-27 MED ORDER — MISOPROSTOL 25 MCG QUARTER TABLET: 25.0000 ug | ORAL_TABLET | ORAL | Status: DC | PRN

## 2020-10-27 MED ORDER — TETANUS-DIPHTH-ACELL PERTUSSIS 5-2.5-18.5 LF-MCG/0.5 IM SUSY: 0.5000 mL | PREFILLED_SYRINGE | Freq: Once | INTRAMUSCULAR | Status: DC

## 2020-10-27 MED ORDER — ONDANSETRON HCL 4 MG/2ML IJ SOLN: 4.0000 mg | INTRAMUSCULAR | Status: DC | PRN

## 2020-10-27 MED ORDER — OXYCODONE-ACETAMINOPHEN 5-325 MG PO TABS: 1.0000 | ORAL_TABLET | ORAL | Status: DC | PRN

## 2020-10-27 MED ORDER — OXYTOCIN-SODIUM CHLORIDE 30-0.9 UT/500ML-% IV SOLN: 2.5000 [IU]/h | INTRAVENOUS | Status: DC

## 2020-10-27 MED ORDER — SIMETHICONE 80 MG PO CHEW: 80.0000 mg | CHEWABLE_TABLET | ORAL | Status: DC | PRN

## 2020-10-27 MED ORDER — TERBUTALINE SULFATE 1 MG/ML IJ SOLN: 0.2500 mg | Freq: Once | INTRAMUSCULAR | Status: DC | PRN

## 2020-10-27 NOTE — Discharge Summary (Signed)
Postpartum Discharge Summary   Patient Name: Tyquisha Sharps DOB: 1990-08-25 MRN: [id number]  Date of admission: 10/27/2020 Delivery date:10/27/2020  Delivering provider: Genia Del  Date of discharge: 10/29/2020  Admitting diagnosis: GDM (gestational diabetes mellitus), class A1 [O24.410] Intrauterine pregnancy: [redacted]w[redacted]d    Secondary diagnosis:  Principal Problem:   Vaginal delivery Active Problems:   Language barrier   History of postpartum hemorrhage   Short interval between pregnancies affecting pregnancy, antepartum   Echogenic focus of heart of fetus affecting antepartum care of mother   Two vessel umbilical cord   GDM (gestational diabetes mellitus), class A1  Additional problems: None    Discharge diagnosis: Term Pregnancy Delivered                                              Post partum procedures: N/A Augmentation:  None Complications: None  Hospital course: Onset of Labor With Vaginal Delivery      30y.o. yo G5P5005 at 366w6das admitted in Active Labor on 10/27/2020. Patient had an uncomplicated labor course as follows:  Membrane Rupture Time/Date: 7:06 PM ,10/27/2020   Delivery Method:Vaginal, Spontaneous  Episiotomy: None  Lacerations:  None  Patient had an uncomplicated postpartum course.  She is ambulating, tolerating a regular diet, passing flatus, and urinating well. Patient is discharged home in stable condition on 10/27/20.  Newborn Data: Birth date:10/27/2020  Birth time:7:06 PM  Gender:Female  Living status:Living  Apgars:9 ,9  Weight:3374 g   Magnesium Sulfate received: No BMZ received: No Rhophylac:N/A MMR:N/A - Immune T-DaP:Given prenatally Flu: N/A Transfusion:No  Physical exam  Vitals:   10/27/20 1931 10/27/20 1946 10/27/20 2001 10/27/20 2016  BP: 110/72 106/63 113/70 112/72  Pulse: 75 74 73 78  Resp:      Temp:      TempSrc:      Weight:      Height:       General: alert, cooperative, and no distress Lochia:  appropriate Uterine Fundus: firm Incision: N/A DVT Evaluation: No evidence of DVT seen on physical exam. Labs: Lab Results  Component Value Date   WBC 11.4 (H) 10/27/2020   HGB 11.9 (L) 10/27/2020   HCT 36.9 10/27/2020   MCV 71.4 (L) 10/27/2020   PLT 227 10/27/2020   CMP Latest Ref Rng & Units 08/13/2017  Glucose 65 - 99 mg/dL 75   Edinburgh Score: Edinburgh Postnatal Depression Scale Screening Tool 12/29/2019  I have been able to laugh and see the funny side of things. 0  I have looked forward with enjoyment to things. 0  I have blamed myself unnecessarily when things went wrong. 0  I have been anxious or worried for no good reason. 0  I have felt scared or panicky for no good reason. 0  Things have been getting on top of me. 0  I have been so unhappy that I have had difficulty sleeping. 0  I have felt sad or miserable. 0  I have been so unhappy that I have been crying. 0  The thought of harming myself has occurred to me. 0  Edinburgh Postnatal Depression Scale Total 0     After visit meds:  Allergies as of 10/29/2020   No Known Allergies      Medication List     STOP taking these medications    Accu-Chek Guide test strip  Generic drug: glucose blood   Accu-Chek Softclix Lancets lancets   acetaminophen 325 MG tablet Commonly known as: Tylenol   senna-docusate 8.6-50 MG tablet Commonly known as: Senokot-S       TAKE these medications    benzocaine-Menthol 20-0.5 % Aero Commonly known as: DERMOPLAST Apply 1 application topically as needed for irritation (perineal discomfort).   ferrous sulfate 325 (65 FE) MG tablet Take 1 tablet (325 mg total) by mouth daily with breakfast. Best to take with a cup of orange juice.   ibuprofen 600 MG tablet Commonly known as: ADVIL Take 1 tablet (600 mg total) by mouth every 6 (six) hours.   prenatal vitamin w/FE, FA 27-1 MG Tabs tablet Take 1 tablet by mouth daily at 12 noon.        Discharge home in stable  condition Infant Feeding: Bottle and Breast Infant Disposition:home with mother Discharge instruction: per After Visit Summary and Postpartum booklet. Activity: Advance as tolerated. Pelvic rest for 6 weeks.  Diet: routine diet Future Appointments: Future Appointments  Date Time Provider Foster  11/01/2020  9:35 AM Aletha Halim, MD [facility]   Follow up Visit: Message sent by Vilma Meckel, MD on 8/5 at 8:35 PM.  Please schedule this patient for a In person postpartum visit in 6 weeks with the following provider: Any provider. Additional Postpartum F/U: 2 hour GTT  High risk pregnancy complicated by: GDM Delivery mode:  Vaginal, Spontaneous  Anticipated Birth Control:  Unsure, considering Depo, but wants to wait until PP visit to decide     10/29/2020 Renee Harder, CNM

## 2020-10-27 NOTE — Progress Notes (Signed)
Post Partum Day 0  Subjective: Denies complaints or concerns. FOB at bedside holding newborn  Objective: Blood pressure 130/83, pulse 69, temperature 98.6 F (37 C), temperature source Axillary, resp. rate 18, height 5\' 1"  (1.549 m), weight 62.1 kg, SpO2 100 %, unknown if currently breastfeeding.  Physical Exam:  General: alert, cooperative, appears stated age, and no distress Lochia: see below Uterine Fundus: firm   Recent Labs    10/27/20 1655 10/27/20 2130  HGB 11.9* 12.3  HCT 36.9 37.5    Assessment/Plan: Called to bedside by 12/27/20 for newly deviated uterus Visible bulge with deviation to maternal right Multiple very large clots dislodged with fundal pressure by CNM VSS, patient asymptomatic L&D team to weight clots, given IM Methergine prior to moving to Postpartum  [employer], CNM 10/27/2020, 2030

## 2020-10-27 NOTE — Telephone Encounter (Signed)
Received a voicemail from this am from a female leaving Rickita name, phone number and that she is calling about her scheduled delivery date. Callan Norden,RN

## 2020-10-27 NOTE — Progress Notes (Signed)
Post Partum Day 0 Subjective: Denies complaints or concerns  Objective: Blood pressure 117/73, pulse 66, temperature 99.1 F (37.3 C), temperature source Axillary, resp. rate 18, height 5\' 1"  (1.549 m), weight 62.1 kg, SpO2 100 %, unknown if currently breastfeeding.  Physical Exam:  General: alert, cooperative, appears stated age, and no distress Lochia: See below Uterine Fundus: firm   Recent Labs    10/27/20 1655 10/27/20 2130  HGB 11.9* 12.3  HCT 36.9 37.5    Assessment/Plan: Called to postpartum room by L&D and postpartum nurses S/p passing of one "hamburger sized clot" CNM at bedside VSS, fundus firm, small additional dark red clots dislodged with fundal pressure by CNM 30 min since last void Will have patient void on bedpan Will order additional Pitocin bolus Will order repeat CBC Will consider Methergine series if additional bleeding/change in status   LOS: 0 days   12/27/20, CNM 10/27/2020, 2130

## 2020-10-27 NOTE — H&P (Addendum)
OBSTETRIC ADMISSION HISTORY AND PHYSICAL  Courtney Clark is a 30 y.o. female [phone] with IUP at [redacted]w[redacted]d by LMP consistent with 24 week [place] presenting for spontaneous onset of labor. She has been having contractions since 1200 PM. She reports +FMs, no LOF, no VB, no blurry vision, headaches, peripheral edema, or RUQ pain.  She plans on breast and bottle feeding. She is undecided for birth control. She received her prenatal care at [facility].  Dating: By LMP --->  Estimated Date of Delivery: 11/04/20  Sono:   @[redacted]w[redacted]d , CWD, normal anatomy, cephalic presentation, placenta anterior, 3271g, 84% EFW  Prenatal History/Complications:  GDMA1 Hx postpartum hemorrhage Language barrier  Short interval pregnancies  Echogenic focus of fetal heart  Two vessel umbilical cord  Past Medical History: Past Medical History:  Diagnosis Date   Anemia    PP   History of diet controlled gestational diabetes mellitus (GDM) 08/17/2019   Language barrier 05/21/2012   NSVD (normal spontaneous vaginal delivery) 05/21/2012    Past Surgical History: Past Surgical History:  Procedure Laterality Date   NO PAST SURGERIES      Obstetrical History: OB History     Gravida  5   Para  4   Term  4   Preterm  0   AB  0   Living  4      SAB  0   IAB  0   Ectopic  0   Multiple  0   Live Births  4           Social History Social History   Socioeconomic History   Marital status: Married    Spouse name: [name]   Number of children: 1   Years of education: 5   Highest education level: Not on file  Occupational History   Occupation: HOMEMAKER  Tobacco Use   Smoking status: Never   Smokeless tobacco: Never  Vaping Use   Vaping Use: Never used  Substance and Sexual Activity   Alcohol use: No   Drug use: No   Sexual activity: Yes    Birth control/protection: Pill  Other Topics Concern   Not on file  Social History Narrative   Not on file   Social Determinants of Health   Financial Resource  Strain: Not on file  Food Insecurity: No Food Insecurity   Worried About Running Out of Food in the Last Year: Never true   Ran Out of Food in the Last Year: Never true  Transportation Needs: No Transportation Needs   Lack of Transportation (Medical): No   Lack of Transportation (Non-Medical): No  Physical Activity: Not on file  Stress: Not on file  Social Connections: Not on file    Family History: Family History  Problem Relation Age of Onset   Heart disease Mother     Allergies: No Known Allergies  Medications Prior to Admission  Medication Sig Dispense Refill Last Dose   prenatal vitamin w/FE, FA (PRENATAL 1 + 1) 27-1 MG TABS tablet Take 1 tablet by mouth daily at 12 noon. 30 tablet 11 10/26/2020   Accu-Chek Softclix Lancets lancets Use as instructed; check blood glucose 4 times daily. 100 each 12    acetaminophen (TYLENOL) 325 MG tablet Take 2 tablets (650 mg total) by mouth every 4 (four) hours as needed (for pain scale < 4). (Patient not taking: No sig reported)      ferrous sulfate 325 (65 FE) MG tablet Take 1 tablet (325 mg total) by mouth daily with  breakfast. Best to take with a cup of orange juice. 100 tablet 2    glucose blood (ACCU-CHEK GUIDE) test strip Use as instructed; check blood glucose 4 times daily. 100 each 12    senna-docusate (SENOKOT-S) 8.6-50 MG tablet Take 2 tablets by mouth daily. (Patient not taking: No sig reported)       Review of Systems   All systems reviewed and negative except as stated in HPI  Blood pressure 131/86, pulse (!) 105, temperature 98.6 F (37 C), temperature source Oral, resp. rate 18, unknown if currently breastfeeding.  General appearance: alert, cooperative, and no distress Lungs: clear to auscultation bilaterally Heart: regular rate and rhythm Abdomen: soft, non-tender; bowel sounds normal Extremities: Homans sign is negative, no sign of DVT Presentation: cephalic Fetal monitoringBaseline: 145 bpm, Variability: Good {> 6  bpm), Accelerations: Reactive, and Decelerations: Absent Uterine activity: q3-4 min Dilation: 7 Effacement (%): 70 Station: -1 Exam by:: K.WilsonRN  Prenatal labs: ABO, Rh: --/--/PENDING (08/05 1655) Antibody: PENDING (08/05 1655) Rubella: 3.13 (05/12 1446) RPR: Non Reactive (05/12 1446)  HBsAg: Negative (05/12 1446)  HIV: Non Reactive (05/12 1446)  GBS: Negative/-- (07/19 1439)  1 hr Glucola - Elevated at 204 Genetic screening declined Anatomy US - Single umbilical artery, echogenic intracardiac focus  Prenatal Transfer Tool  Maternal Diabetes: Yes:  Diabetes Type:  Diet controlled Genetic Screening: Declined Maternal Ultrasounds/Referrals: Other: Single umbilical artery, echogenic intracardiac focus Fetal Ultrasounds or other Referrals:  None Maternal Substance Abuse:  No Significant Maternal Medications:  None Significant Maternal Lab Results: Group B Strep negative, O POS  Results for orders placed or performed during the hospital encounter of 10/27/20 (from the past 24 hour(s))  Type and screen [facility]   Collection Time: 10/27/20  4:55 PM  Result Value Ref Range   ABO/RH(D) PENDING    Antibody Screen PENDING    Sample Expiration      10/30/2020,2359 Performed at [facility], 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 G5P4004 at [redacted]w[redacted]d here for SOL.   #Labor: Continue expectant management #Pain: Per patient preference #FWB: Cat 1 #ID:  GBS negative #MOF: Breast and bottle #MOC: Unsure #GDMA1: CBG q4 #Hx PPH: TXA at delivery   GME ATTESTATION:  I saw and evaluated the patient. I agree with the findings and the plan of care as documented in the resident's note. I have made changes to documentation as necessary.  Evalina Field, MD OB Fellow, Faculty [facility], Center for [facility] 10/27/2020 5:33 PM

## 2020-10-27 NOTE — MAU Note (Signed)
Ctx since 1200. Every few minutes. Denies any leaking or bleeding good fetal movement felt.

## 2020-10-28 ENCOUNTER — Inpatient Hospital Stay (HOSPITAL_COMMUNITY): Admission: AD | Admit: 2020-10-28 | Payer: Medicaid Other | Source: Home / Self Care | Admitting: Family Medicine

## 2020-10-28 ENCOUNTER — Inpatient Hospital Stay (HOSPITAL_COMMUNITY): Payer: Medicaid Other

## 2020-10-28 LAB — GLUCOSE, CAPILLARY: Glucose-Capillary: 79 mg/dL (ref 70–99)

## 2020-10-28 LAB — RPR: RPR Ser Ql: NONREACTIVE

## 2020-10-28 NOTE — Progress Notes (Signed)
Post Partum Day 1 Subjective: no complaints, up ad lib, voiding, and tolerating PO  Objective: Blood pressure 94/65, pulse 67, temperature 98.1 F (36.7 C), temperature source Oral, resp. rate 18, height 5\' 1"  (1.549 m), weight 62.1 kg, SpO2 100 %, unknown if currently breastfeeding.  Physical Exam:  General: alert, cooperative, appears stated age, and no distress Lochia: appropriate Uterine Fundus: firm Incision: N/A DVT Evaluation: No evidence of DVT seen on physical exam.  Recent Labs    10/27/20 1655 10/27/20 2130  HGB 11.9* 12.3  HCT 36.9 37.5    Assessment/Plan: Bleeding well-controlled s/p interventions for PPH VSS, patient able to perform care of self and newborn without difficulty Plan for d/c home tomorrow   LOS: 1 day   12/27/20, CNM 10/28/2020, 7:11 AM

## 2020-10-28 NOTE — Plan of Care (Signed)
  Problem: Education: Goal: Knowledge of condition will improve Outcome: Completed/Met   Problem: Activity: Goal: Will verbalize the importance of balancing activity with adequate rest periods Outcome: Completed/Met Goal: Ability to tolerate increased activity will improve Outcome: Completed/Met   Problem: Role Relationship: Goal: Ability to demonstrate positive interaction with newborn will improve Outcome: Completed/Met   Problem: Skin Integrity: Goal: Demonstration of wound healing without infection will improve Outcome: Completed/Met

## 2020-10-29 MED ORDER — IBUPROFEN 600 MG PO TABS: 600.0000 mg | ORAL_TABLET | Freq: Four times a day (QID) | ORAL | 0 refills | Status: AC

## 2020-10-29 MED ORDER — BENZOCAINE-MENTHOL 20-0.5 % EX AERO: 1.0000 "application " | INHALATION_SPRAY | CUTANEOUS | 0 refills | Status: AC | PRN

## 2020-10-29 NOTE — Plan of Care (Signed)
  Problem: Education: Goal: Knowledge of General Education information will improve Description: Including pain rating scale, medication(s)/side effects and non-pharmacologic comfort measures Outcome: Completed/Met   Problem: Clinical Measurements: Goal: Ability to maintain clinical measurements within normal limits will improve Outcome: Completed/Met Goal: Will remain free from infection Outcome: Completed/Met Goal: Diagnostic test results will improve Outcome: Completed/Met Goal: Respiratory complications will improve Outcome: Completed/Met Goal: Cardiovascular complication will be avoided Outcome: Completed/Met   Problem: Activity: Goal: Risk for activity intolerance will decrease Outcome: Completed/Met   Problem: Nutrition: Goal: Adequate nutrition will be maintained Outcome: Completed/Met   Problem: Elimination: Goal: Will not experience complications related to bowel motility Outcome: Completed/Met Goal: Will not experience complications related to urinary retention Outcome: Completed/Met   Problem: Pain Managment: Goal: General experience of comfort will improve Outcome: Completed/Met   Problem: Safety: Goal: Ability to remain free from injury will improve Outcome: Completed/Met   Problem: Skin Integrity: Goal: Risk for impaired skin integrity will decrease Outcome: Completed/Met   Problem: Coping: Goal: Ability to identify and utilize available resources and services will improve Outcome: Completed/Met   Problem: Life Cycle: Goal: Chance of risk for complications during the postpartum period will decrease Outcome: Completed/Met

## 2020-10-29 NOTE — Plan of Care (Signed)
  Problem: Education: Goal: Knowledge of Childbirth will improve Outcome: Adequate for Discharge Goal: Ability to make informed decisions regarding treatment and plan of care will improve Outcome: Adequate for Discharge Goal: Ability to state and carry out methods to decrease the pain will improve Outcome: Adequate for Discharge Goal: Individualized Educational Video(s) Outcome: Adequate for Discharge   Problem: Coping: Goal: Ability to verbalize concerns and feelings about labor and delivery will improve Outcome: Adequate for Discharge   Problem: Life Cycle: Goal: Ability to make normal progression through stages of labor will improve Outcome: Adequate for Discharge Goal: Ability to effectively push during vaginal delivery will improve Outcome: Adequate for Discharge   Problem: Role Relationship: Goal: Will demonstrate positive interactions with the child Outcome: Adequate for Discharge   Problem: Safety: Goal: Risk of complications during labor and delivery will decrease Outcome: Adequate for Discharge   Problem: Pain Management: Goal: Relief or control of pain from uterine contractions will improve Outcome: Adequate for Discharge   Problem: Health Behavior/Discharge Planning: Goal: Ability to manage health-related needs will improve Outcome: Adequate for Discharge   Problem: Coping: Goal: Level of anxiety will decrease Outcome: Adequate for Discharge   Problem: Education: Goal: Individualized Educational Video(s) Outcome: Adequate for Discharge Goal: Individualized Newborn Educational Video(s) Outcome: Adequate for Discharge

## 2020-10-29 NOTE — Progress Notes (Signed)
Reviewed discharge postpartum instructions with patient and husband regarding medications, when to call MD/go to MAU, signs and symptoms of pre-e, bleeding, postpartum course, and to follow up with OB for postpartum check in 4-6 weeks. Patient and husband verbalized understanding of discharge postpartum instructions and asked appropriate questions.

## 2020-10-30 ENCOUNTER — Telehealth: Payer: Self-pay

## 2020-10-30 LAB — TYPE AND SCREEN
ABO/RH(D): O POS
Antibody Screen: NEGATIVE

## 2020-10-30 NOTE — Telephone Encounter (Signed)
Transition Care Management Unsuccessful Follow-up Telephone Call  Date of discharge and from where:  10/29/2020-[facility]  Attempts:  1st Attempt  Reason for unsuccessful TCM follow-up call:  Unable to leave message

## 2020-10-30 NOTE — Telephone Encounter (Signed)
Called pt with [employer] interpreter Ke[employer]. Per chart review, pt delivered on 10/27/20. Pt reports no continued concerns. Would like to know when PP appt will be. Explained front office will be in touch to schedule this appt for approx 4-6 weeks away.

## 2020-10-31 ENCOUNTER — Encounter: Payer: Self-pay | Admitting: *Deleted

## 2020-10-31 NOTE — Telephone Encounter (Signed)
Transition Care Management Follow-up Telephone Call Date of discharge and from where: 10/29/2020-[facility] How have you been since you were released from the hospital? Patient stated she is doing good.  Any questions or concerns? No  Items Reviewed: Did the pt receive and understand the discharge instructions provided? Yes  Medications obtained and verified? Yes  Other? No  Any new allergies since your discharge? No  Dietary orders reviewed? N/A Do you have support at home? Yes   Home Care and Equipment/Supplies: Were home health services ordered? not applicable If so, what is the name of the agency? N/A  Has the agency set up a time to come to the patient's home? not applicable Were any new equipment or medical supplies ordered?  No What is the name of the medical supply agency? N/A Were you able to get the supplies/equipment? not applicable Do you have any questions related to the use of the equipment or supplies? No  Functional Questionnaire: (I = Independent and D = Dependent) ADLs: I  Bathing/Dressing- I  Meal Prep- I  Eating- I  Maintaining continence- I  Transferring/Ambulation- I  Managing Meds- I  Follow up appointments reviewed:  PCP Hospital f/u appt confirmed? No   Specialist Hospital f/u appt confirmed? Yes  Scheduled to see Dr. Vergie Living on 11/01/2020 @ 9:35 am. Are transportation arrangements needed? No  If their condition worsens, is the pt aware to call PCP or go to the Emergency Dept.? Yes Was the patient provided with contact information for the PCP's office or ED? Yes Was to pt encouraged to call back with questions or concerns? Yes

## 2020-11-01 ENCOUNTER — Encounter: Payer: Medicaid Other | Admitting: Obstetrics and Gynecology

## 2020-11-01 LAB — RPR

## 2020-11-09 ENCOUNTER — Telehealth (HOSPITAL_COMMUNITY): Payer: Self-pay | Admitting: *Deleted

## 2020-11-09 NOTE — Telephone Encounter (Signed)
No voicemail set up. No message left by interpreter.  Duffy Rhody, RN 11-09-2020 at 2:08pm

## 2020-12-03 IMAGING — US US MFM OB FOLLOW-UP
1 series · 14 of 28 positions shown · non-contrast
Comparison: none

[Series 1: us mfm ob follow-up · 51 acquisitions, 14 frames shown]
[im 2/51]
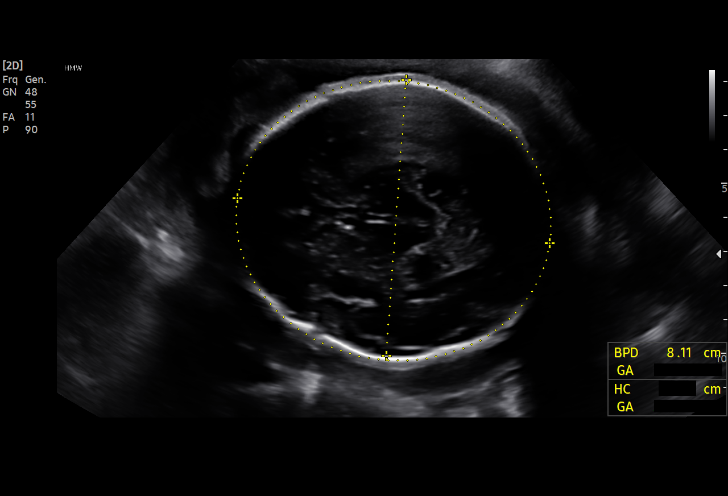
[im 6/51]
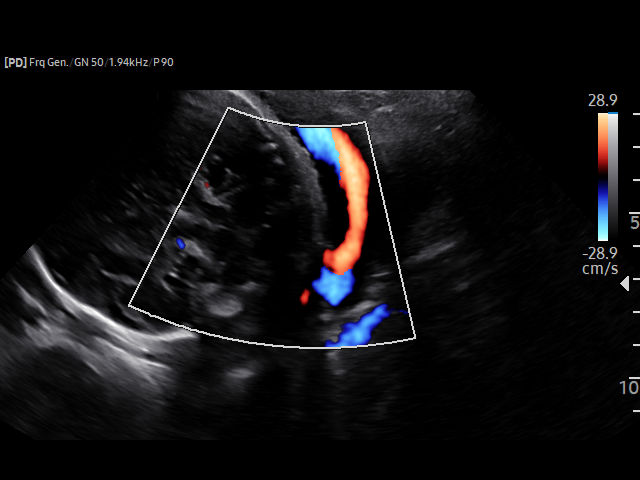
[im 10/51]
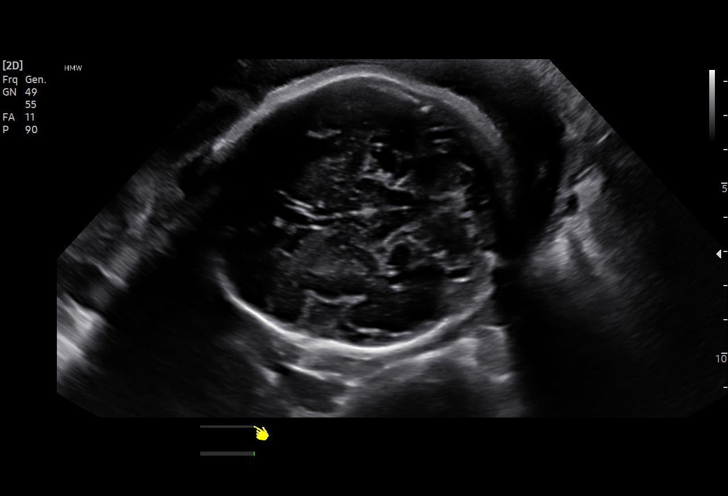
[im 13/51]
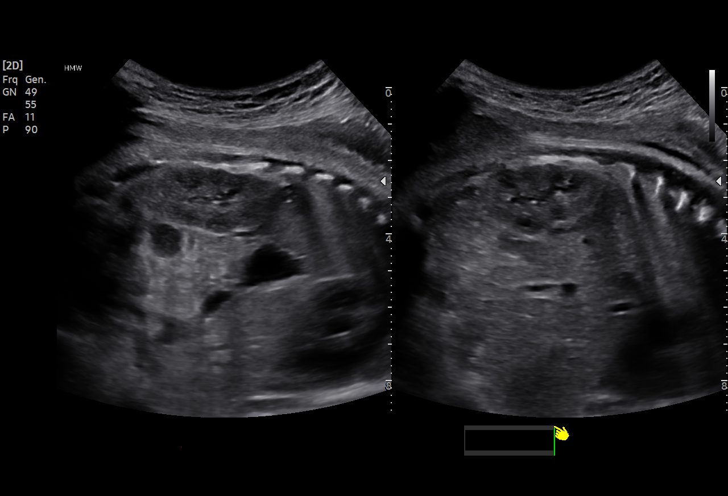
[im 17/51]
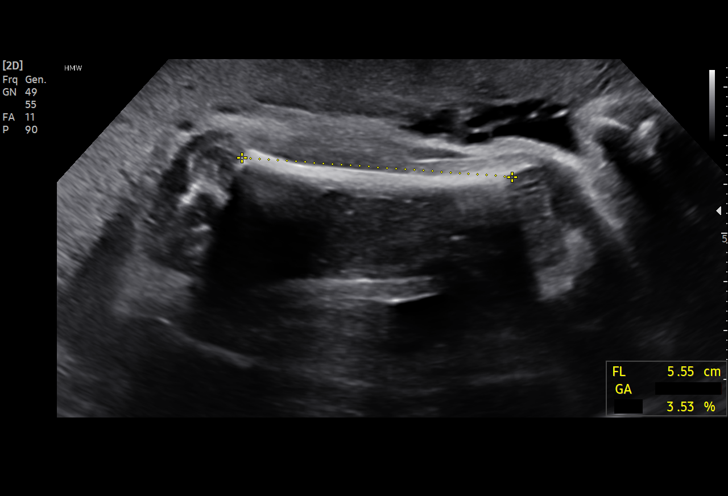
[im 21/51]
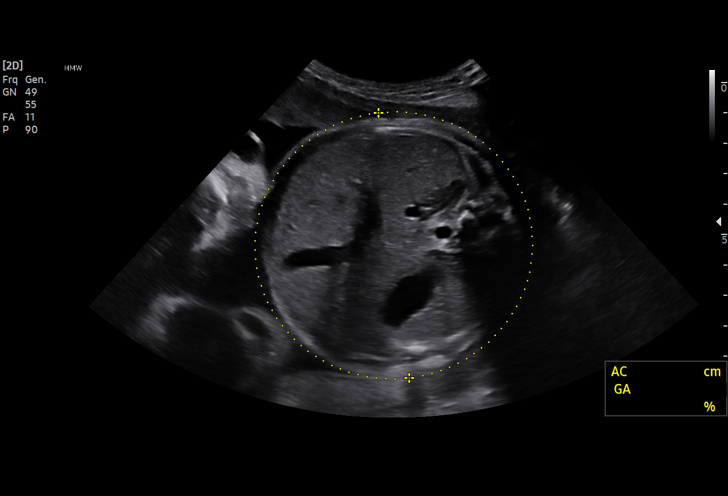
[im 25/51]
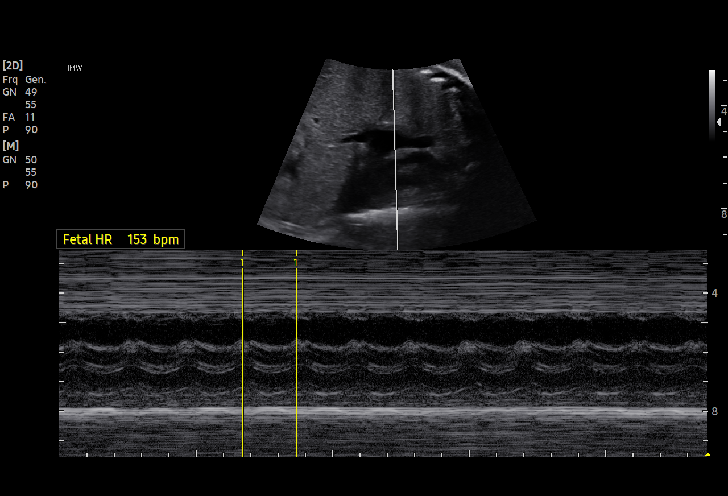
[im 28/51]
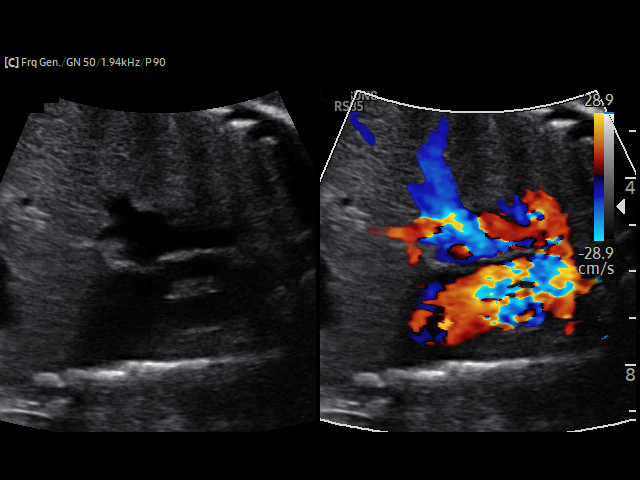
[im 32/51]
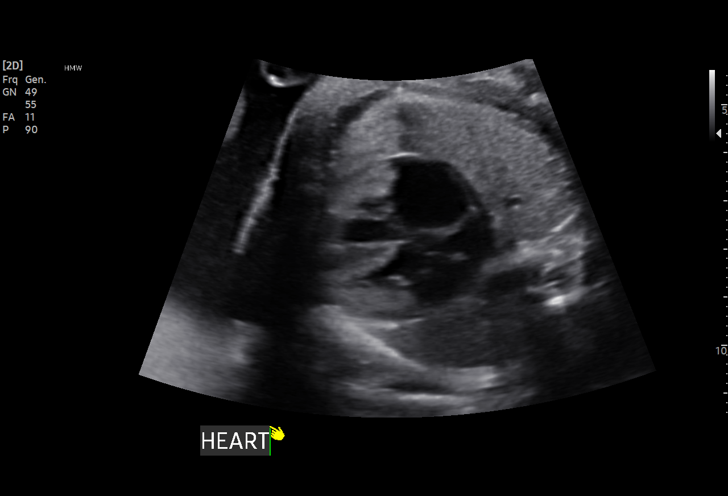
[im 36/51]
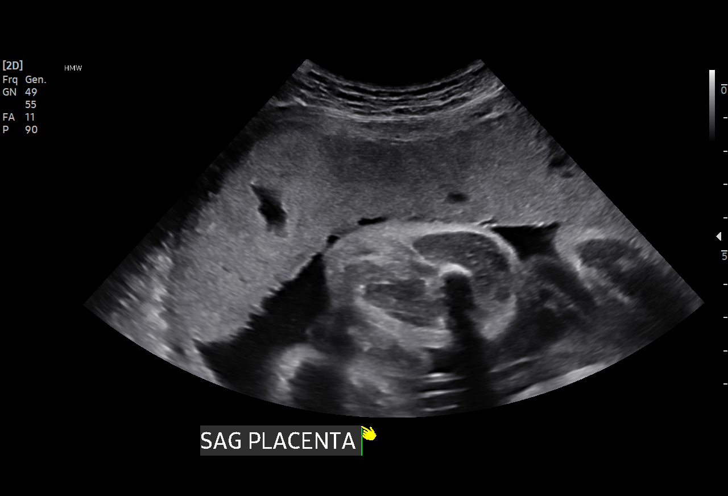
[im 39/51]
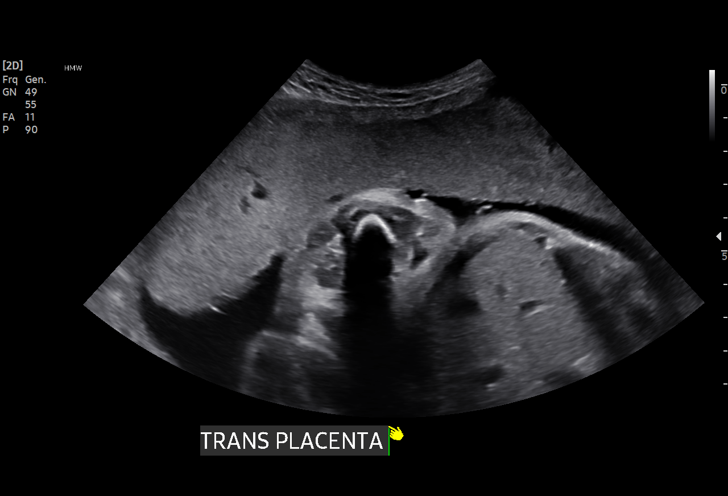
[im 43/51]
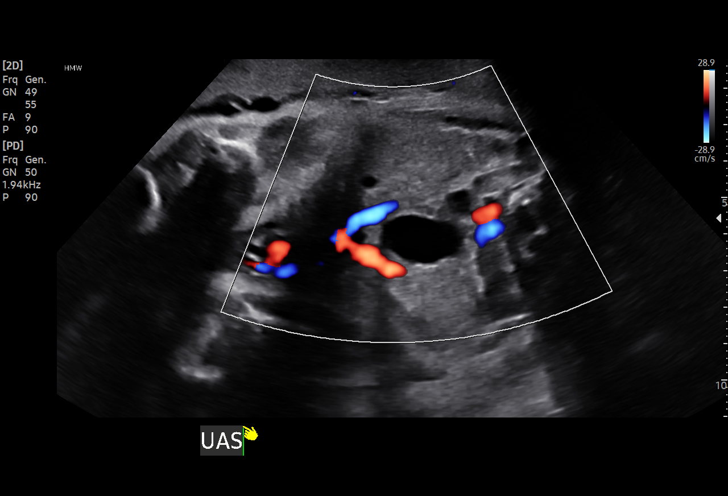
[im 47/51]
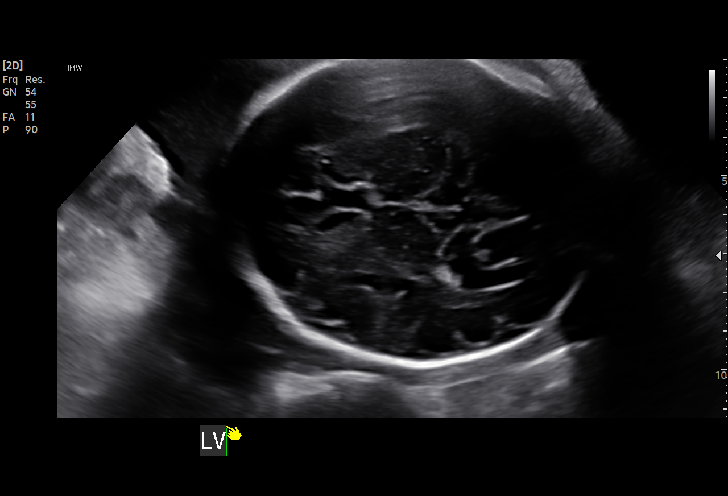
[im 51/51]
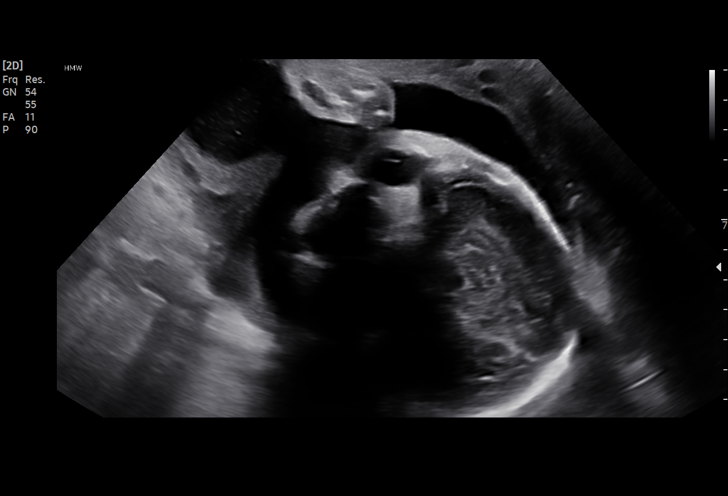

[14 of 28 positions shown; findings below may reference images not displayed]

BAJRAKTAR NP

Indications

 31 weeks gestation of pregnancy
 Poor obstetric history: Previous gestational
 diabetes
 Late to prenatal care, third trimester
 Encounter for other antenatal screening
 follow-up
Fetal Evaluation

 Num Of Fetuses:         1
 Fetal Heart Rate(bpm):  153
 Cardiac Activity:       Observed
 Presentation:           Cephalic
 Placenta:               Anterior
 P. Cord Insertion:      Previously Visualized

 Amniotic Fluid
 AFI FV:      Within normal limits

 AFI Sum(cm)     %Tile       Largest Pocket(cm)
 16              58          5

 RUQ(cm)       RLQ(cm)       LUQ(cm)        LLQ(cm)
 4.6           5
Biometry

 BPD:        81  mm     G. Age:  32w 4d         80  %    CI:        85.33   %    70 - 86
                                                         FL/HC:      20.3   %    19.3 -
 HC:      276.2  mm     G. Age:  30w 1d        3.7  %    HC/AC:      1.01        0.96 -
 AC:      274.1  mm     G. Age:  31w 3d         57  %    FL/BPD:     69.4   %    71 - 87
 FL:       56.2  mm     G. Age:  29w 4d          6  %    FL/AC:      20.5   %    20 - 24

 Est. FW:    2730  gm    3 lb 10 oz      28  %
OB History

 Gravidity:    4         Term:   3
 Living:       3
Gestational Age

 LMP:           31w 1d        Date:  03/01/19                 EDD:   12/06/19
 U/S Today:     31w 0d                                        EDD:   12/07/19
 Best:          31w 1d     Det. By:  LMP  (03/01/19)          EDD:   12/06/19
Anatomy

 Cranium:               Appears normal         Aortic Arch:            Previously seen
 Cavum:                 Previously seen        Ductal Arch:            Not well visualized
 Ventricles:            Appears normal         Diaphragm:              Appears normal
 Choroid Plexus:        Previously seen        Stomach:                Appears normal, left
                                                                       sided
 Cerebellum:            Appears normal         Abdomen:                Previously seen
 Posterior Fossa:       Appears normal         Abdominal Wall:         Previously seen
 Nuchal Fold:           Not applicable (>20    Cord Vessels:           Previously seen
                        wks GA)
 Face:                  Profile previously     Kidneys:                Appear normal
                        seen
 Lips:                  Previously seen        Bladder:                Appears normal
 Thoracic:              Appears normal         Spine:                  Previously seen
 Heart:                 Appears normal         Upper Extremities:      Previously seen
                        (4CH, axis, and
                        situs)
 RVOT:                  Appears normal         Lower Extremities:      Previously seen
 LVOT:                  Appears normal

 Other:  Technically difficult due to fetal position. Fetus appears to be a male.
Cervix Uterus Adnexa

 Cervix
 Not visualized (advanced GA >18wks)

 Uterus
 No abnormality visualized.

 Right Ovary
 No adnexal mass visualized.

 Left Ovary
 No adnexal mass visualized.

 Cul De Sac
 No free fluid seen.

 Adnexa
 No abnormality visualized.
Impression

 Follow up growth and anatomy
 Normal interval growth with measurements consistent with
 dates
 Good fetal movement and amniotic fluid volume
 Suboptimal views of the fetal ductus arteriousus was
 observed however, it was visualized and appears normal.
Recommendations

 Follow up growth as clinically indicated.

## 2020-12-04 ENCOUNTER — Ambulatory Visit (INDEPENDENT_AMBULATORY_CARE_PROVIDER_SITE_OTHER): Payer: Medicaid Other | Admitting: Obstetrics and Gynecology

## 2020-12-04 ENCOUNTER — Other Ambulatory Visit: Payer: Self-pay

## 2020-12-04 ENCOUNTER — Encounter: Payer: Self-pay | Admitting: Obstetrics and Gynecology

## 2020-12-04 VITALS — BP 125/83 | HR 64 | Ht 62.0 in | Wt 115.6 lb

## 2020-12-04 DIAGNOSIS — Z30013 Encounter for initial prescription of injectable contraceptive: Secondary | ICD-10-CM

## 2020-12-04 DIAGNOSIS — O24410 Gestational diabetes mellitus in pregnancy, diet controlled: Secondary | ICD-10-CM

## 2020-12-04 LAB — POCT PREGNANCY, URINE: Preg Test, Ur: NEGATIVE

## 2020-12-04 MED ORDER — MEDROXYPROGESTERONE ACETATE 150 MG/ML IM SUSP
150.0000 mg | Freq: Once | INTRAMUSCULAR | Status: AC
  Administered 2020-12-04: 150 mg via INTRAMUSCULAR

## 2020-12-04 NOTE — Progress Notes (Signed)
    Post Partum Visit Note  Courtney Clark is a 30 y.o. [id number] s/p 8/5 SVD/intact perineum at 38wks; pt admitted for active labor.  Pregnancy c/b GDMa1, hgb E trait  Anesthesia: none. Postpartum course has been uncomplicated. Baby is doing well. Baby is feeding by both breast and bottle - [employer]. Bleeding no bleeding and no period yet. Bowel function is normal. Bladder function is normal. Patient is not sexually active. Contraception method is none. Postpartum depression screening: negative.  12/2019: ASCUS/HPV negative   Edinburgh Postnatal Depression Scale - 12/04/20 1035       Edinburgh Postnatal Depression Scale:  In the Past 7 Days   I have been able to laugh and see the funny side of things. 0    I have looked forward with enjoyment to things. 0    I have blamed myself unnecessarily when things went wrong. 0    I have been anxious or worried for no good reason. 0    I have felt scared or panicky for no good reason. 0    Things have been getting on top of me. 0    I have been so unhappy that I have had difficulty sleeping. 0    I have felt sad or miserable. 0    I have been so unhappy that I have been crying. 0    The thought of harming myself has occurred to me. 0    Edinburgh Postnatal Depression Scale Total 0             Review of Systems Pertinent items noted in HPI and remainder of comprehensive ROS otherwise negative.  Objective:  BP 125/83   Pulse 64   Ht 5\' 2"  (1.575 m)   Wt 115 lb 9.6 oz (52.4 kg)   BMI 21.14 kg/m    General: NAD  Assessment:   Patient doing well  Plan:   *PP: routine care. Pt desires depo provera. Told to consider it effective in one week and let know any problems with intercourse *GDMa1: d/w her doing her 2h GTT with her next depo shot in 20m; pt aware to be fasting.   Interpreter used  1m, MD [facility], [facility]

## 2021-01-10 IMAGING — US US MFM OB FOLLOW-UP
1 series · 13 of 28 positions shown · non-contrast
Comparison: none

[Series 1: us mfm ob follow-up · 13 of 34 slices shown]
[im 2/34]
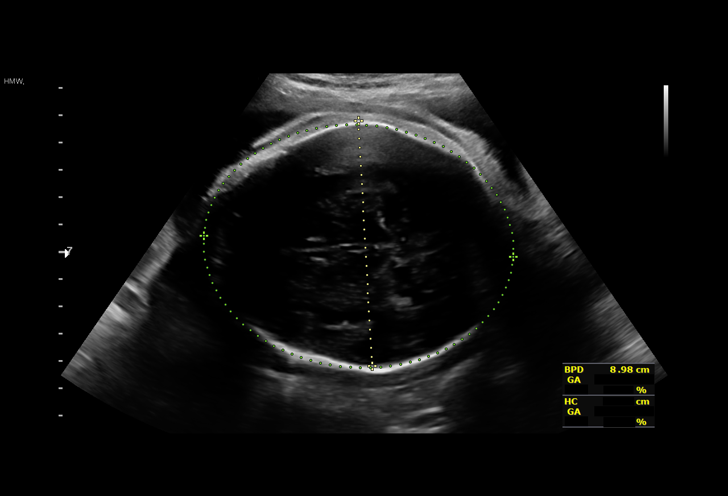
[im 4/34]
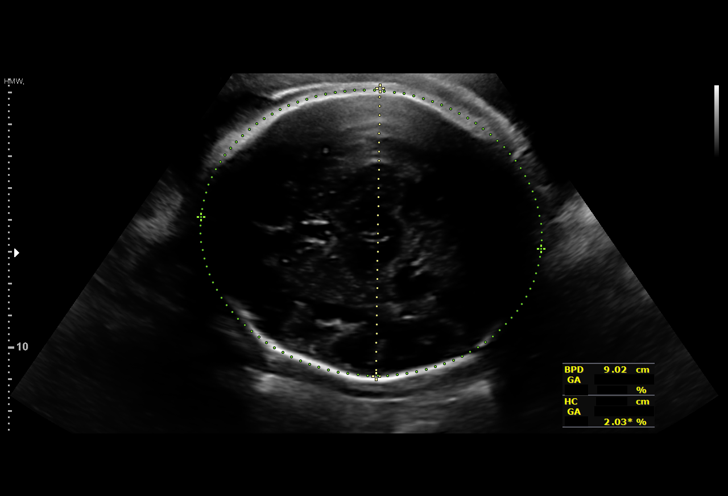
[im 7/34]
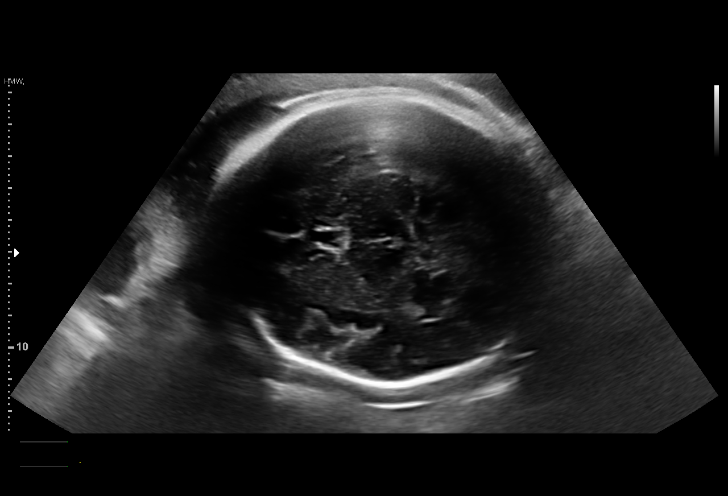
[im 9/34]
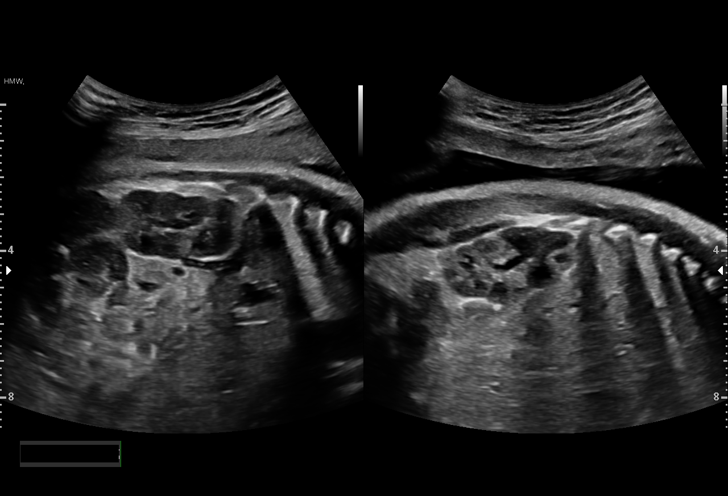
[im 12/34]
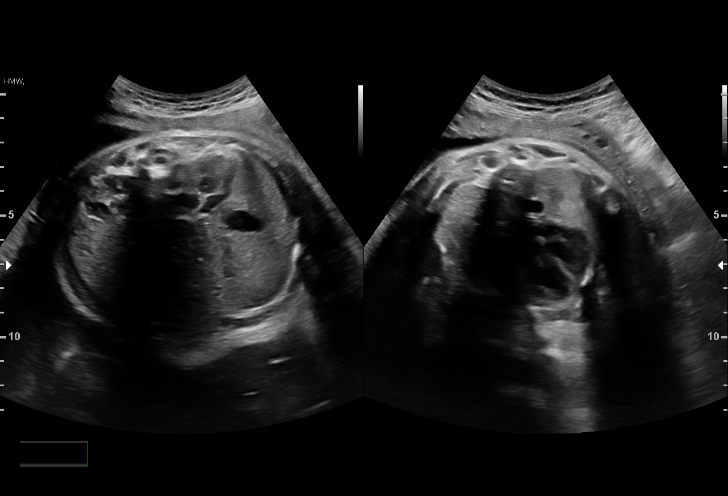
[im 14/34]
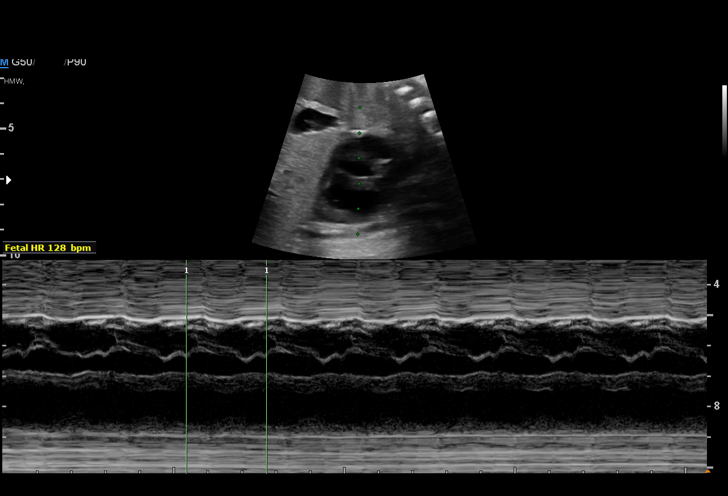
[im 18/34]
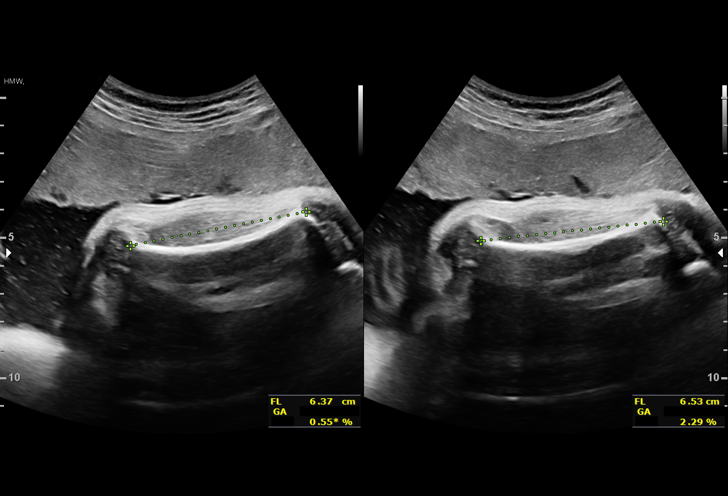
[im 20/34]
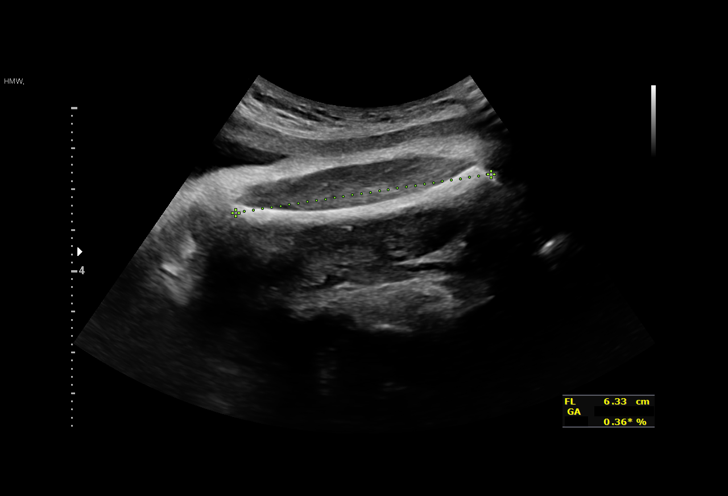
[im 23/34]
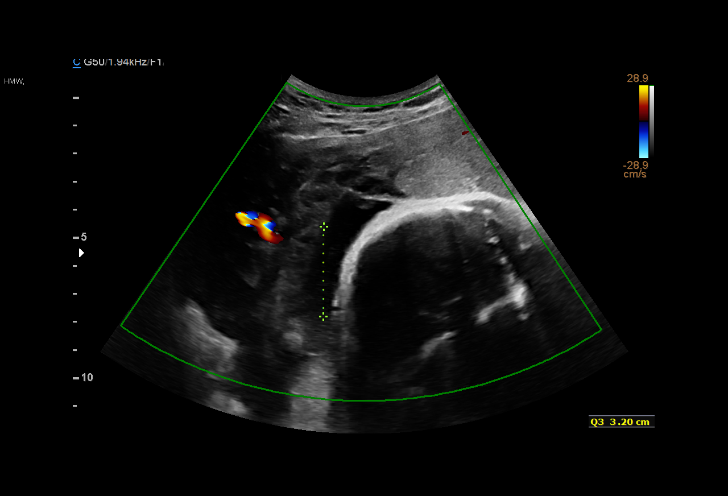
[im 25/34]
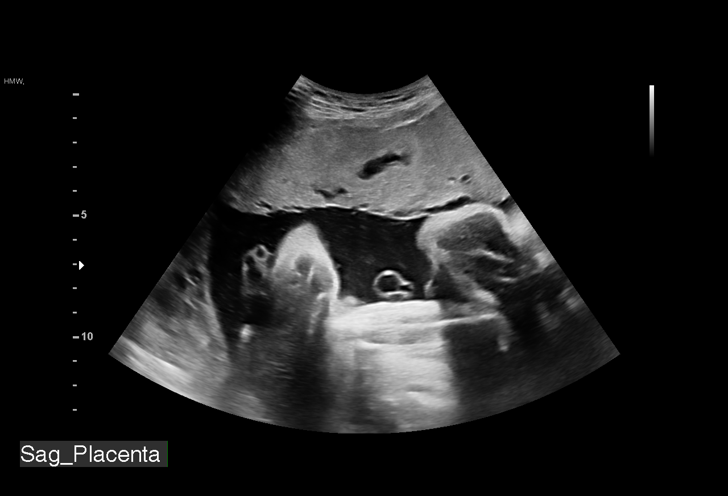
[im 27/34]
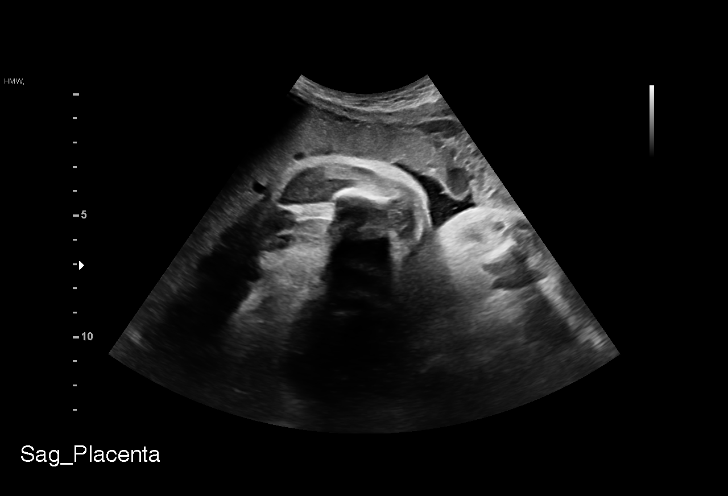
[im 30/34]
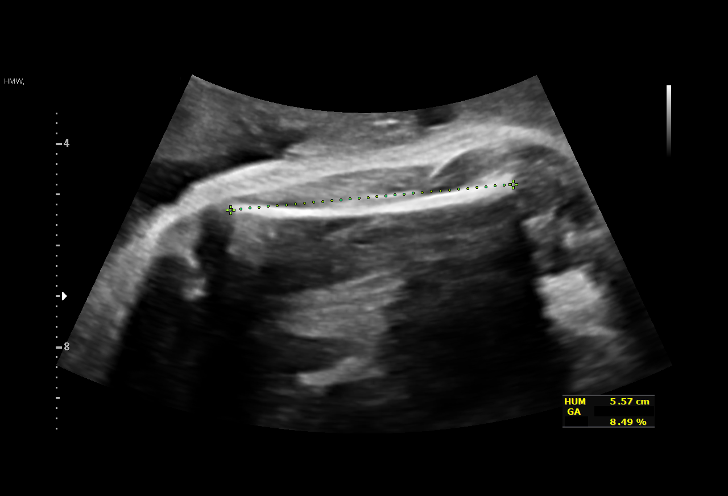
[im 32/34]
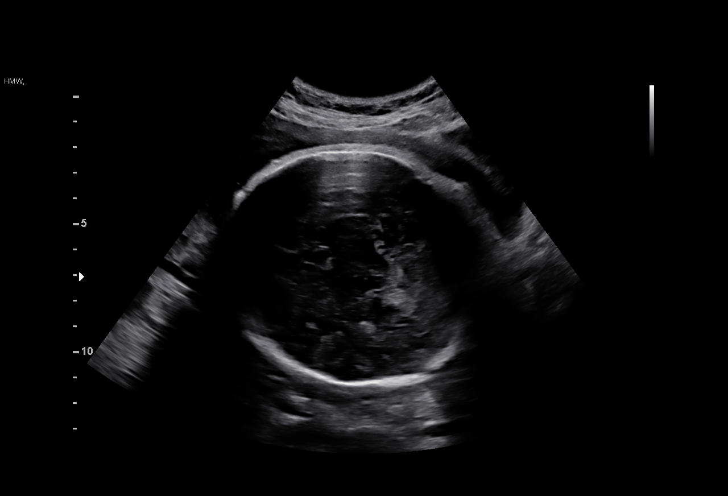

[13 of 28 positions shown; findings below may reference images not displayed]

AKYYEW NP

Indications

 Gestational diabetes in pregnancy, diet
 controlled
 36 weeks gestation of pregnancy
 Poor obstetric history: Previous gestational
 diabetes
 Late to prenatal care, third trimester
 Encounter for other antenatal screening
 follow-up
Fetal Evaluation

 Num Of Fetuses:         1
 Fetal Heart Rate(bpm):  128
 Cardiac Activity:       Observed
 Presentation:           Cephalic
 Placenta:               Anterior
 P. Cord Insertion:      Previously Visualized

 Amniotic Fluid
 AFI FV:      Within normal limits

 AFI Sum(cm)     %Tile       Largest Pocket(cm)
 12.26           40

 RUQ(cm)       RLQ(cm)       LUQ(cm)        LLQ(cm)
 4.7           0
Biometry
 BPD:      90.1  mm     G. Age:  36w 3d         60  %    CI:         80.4   %    70 - 86
                                                         FL/HC:      20.2   %    20.8 -
 HC:      317.4  mm     G. Age:  35w 5d          9  %    HC/AC:      0.98        0.92 -
 AC:      322.4  mm     G. Age:  36w 1d         50  %    FL/BPD:     71.3   %    71 - 87
 FL:       64.2  mm     G. Age:  33w 1d        < 1  %    FL/AC:      19.9   %    20 - 24
 HUM:        56  mm     G. Age:  32w 4d        < 5  %

 Est. FW:    8558  gm    5 lb 14 oz      23  %
OB History

 Gravidity:    4         Term:   3
 Living:       3
Gestational Age

 LMP:           36w 4d        Date:  03/01/19                 EDD:   12/06/19
 U/S Today:     35w 3d                                        EDD:   12/14/19
 Best:          36w 4d     Det. By:  LMP  (03/01/19)          EDD:   12/06/19
Anatomy

 Cranium:               Appears normal         Aortic Arch:            Previously seen
 Cavum:                 Previously seen        Ductal Arch:            Not well visualized
 Ventricles:            Appears normal         Diaphragm:              Previously seen
 Choroid Plexus:        Previously seen        Stomach:                Appears normal, left
                                                                       sided
 Cerebellum:            Appears normal         Abdomen:                Previously seen
 Posterior Fossa:       Appears normal         Abdominal Wall:         Previously seen
 Nuchal Fold:           Not applicable (>20    Cord Vessels:           Previously seen
                        wks GA)
 Face:                  Profile previously     Kidneys:                Appear normal
                        seen
 Lips:                  Previously seen        Bladder:                Appears normal
 Thoracic:              Appears normal         Spine:                  Previously seen
 Heart:                 Previously seen        Upper Extremities:      Previously seen
 RVOT:                  Appears normal         Lower Extremities:      Previously seen
 LVOT:                  Previously seen

 Other:  Technically difficult due to fetal position. Fetus appears to be a male.
Cervix Uterus Adnexa

 Cervix
 Not visualized (advanced GA >06wks)

 Uterus
 No abnormality visualized.

 Right Ovary
 No adnexal mass visualized.

 Left Ovary
 No adnexal mass visualized.
 Cul De Sac
 No free fluid seen.

 Adnexa
 No abnormality visualized.
Comments

 This patient was seen for a follow up growth scan due to diet-
 controlled gestational diabetes.  She denies any problems
 since her last exam and reports that her fingerstick values
 have been within normal limits.
 She was informed that the fetal growth and amniotic fluid
 level appears appropriate for her gestational age.
 As the fetal growth is within normal limits, no further exams
 were scheduled in our office.

## 2021-03-05 ENCOUNTER — Other Ambulatory Visit: Payer: Medicaid Other

## 2021-03-05 ENCOUNTER — Other Ambulatory Visit: Payer: Self-pay

## 2021-03-05 ENCOUNTER — Ambulatory Visit (INDEPENDENT_AMBULATORY_CARE_PROVIDER_SITE_OTHER): Payer: Medicaid Other

## 2021-03-05 VITALS — BP 133/79 | HR 70 | Wt 120.6 lb

## 2021-03-05 DIAGNOSIS — O24410 Gestational diabetes mellitus in pregnancy, diet controlled: Secondary | ICD-10-CM

## 2021-03-05 DIAGNOSIS — Z3042 Encounter for surveillance of injectable contraceptive: Secondary | ICD-10-CM

## 2021-03-05 MED ORDER — MEDROXYPROGESTERONE ACETATE 150 MG/ML IM SUSP
150.0000 mg | Freq: Once | INTRAMUSCULAR | Status: AC
  Administered 2021-03-05: 150 mg via INTRAMUSCULAR

## 2021-03-05 NOTE — Progress Notes (Signed)
Courtney Clark here for Depo-Provera Injection. Also completing PP 2 HR GTT today. Injection administered without complication. Patient will return in 3 months for next injection between 05/21/20 and 06/04/20. Next annual visit due September 2022. Patient to waiting area to wait for next lab draw.  Marjo Bicker, RN 03/05/2021  9:57 AM

## 2021-03-06 ENCOUNTER — Encounter: Payer: Self-pay | Admitting: Obstetrics and Gynecology

## 2021-03-06 LAB — GLUCOSE TOLERANCE, 2 HOURS
Glucose, 2 hour: 125 mg/dL (ref 70–139)
Glucose, GTT - Fasting: 89 mg/dL (ref 70–99)

## 2021-03-13 ENCOUNTER — Telehealth: Payer: Self-pay

## 2021-03-13 NOTE — Telephone Encounter (Addendum)
-----   Message from Blackstone Bing, MD sent at 03/06/2021  8:49 AM EST ----- Can you let her know it's all normal and that she get an a1c qyear to screen for DM2? Thanks   [employer] Requested interpreter call when available. Direct number given until 1730. Call not received. Will call [facility] and request interpreter tomorrow.

## 2021-03-15 NOTE — Telephone Encounter (Signed)
Called patient with community access partner interpreter Y Hin Vicki Mallet, no answer- left message to call us back for results.

## 2021-03-20 NOTE — Telephone Encounter (Signed)
Called pt with [employer] left message that her results are normal and to f/u in one year at PCP.    Addison Naegeli, RN  03/20/21

## 2021-05-21 ENCOUNTER — Other Ambulatory Visit: Payer: Self-pay

## 2021-05-21 ENCOUNTER — Ambulatory Visit (INDEPENDENT_AMBULATORY_CARE_PROVIDER_SITE_OTHER): Payer: Medicaid Other

## 2021-05-21 VITALS — BP 110/60 | HR 79 | Wt 121.3 lb

## 2021-05-21 DIAGNOSIS — Z3042 Encounter for surveillance of injectable contraceptive: Secondary | ICD-10-CM | POA: Diagnosis not present

## 2021-05-21 MED ORDER — MEDROXYPROGESTERONE ACETATE 150 MG/ML IM SUSP
150.0000 mg | Freq: Once | INTRAMUSCULAR | Status: AC
  Administered 2021-05-21: 150 mg via INTRAMUSCULAR

## 2021-05-21 NOTE — Progress Notes (Signed)
Courtney Clark here for Depo-Provera Injection. Injection administered without complication. Patient will return in 3 months for next injection between 08/06/21 and 08/20/21. Next annual visit due September 2023.   Visit today completed without interpreter. No interpreter available. Patient verbalizes she would like to complete visit without interpreter; able to speak and understand limited English.  Annabell Howells, RN 05/21/2021  10:09 AM

## 2021-07-09 ENCOUNTER — Other Ambulatory Visit: Payer: Self-pay | Admitting: Lactation Services

## 2021-07-09 MED ORDER — PRENATAL PLUS 27-1 MG PO TABS: 1.0000 | ORAL_TABLET | Freq: Every day | ORAL | 11 refills | Status: AC

## 2021-08-06 ENCOUNTER — Ambulatory Visit (INDEPENDENT_AMBULATORY_CARE_PROVIDER_SITE_OTHER): Payer: Medicaid Other | Admitting: *Deleted

## 2021-08-06 VITALS — BP 119/73 | HR 95 | Ht 61.5 in | Wt 124.7 lb

## 2021-08-06 DIAGNOSIS — Z3042 Encounter for surveillance of injectable contraceptive: Secondary | ICD-10-CM | POA: Diagnosis not present

## 2021-08-06 MED ORDER — MEDROXYPROGESTERONE ACETATE 150 MG/ML IM SUSP
150.0000 mg | Freq: Once | INTRAMUSCULAR | Status: AC
  Administered 2021-08-06: 150 mg via INTRAMUSCULAR

## 2021-08-06 NOTE — Progress Notes (Signed)
Patient was assessed and managed by nursing staff during this encounter. I have reviewed the chart and agree with the documentation and plan. I have also made any necessary editorial changes. ? ?Warden Fillers, MD ?08/06/2021 9:23 AM   ?

## 2021-08-06 NOTE — Addendum Note (Signed)
Addended byGerome Apley on: 08/06/2021 11:31 AM ? ? Modules accepted: Level of Service ? ?

## 2021-08-06 NOTE — Progress Notes (Signed)
Here for depoprovera. Interpreter not present. Patient declines interporeter by ipad or phone. Answered questions appropriately. Tolerated injection well without complaint. Next injection due 10/22/21 to 11/05/21. Last pap was 12/29/19. Last exam was 12/04/20. Last depoprovera was 05/21/21.  ?Nancy Fetter ?

## 2021-10-22 ENCOUNTER — Ambulatory Visit: Payer: Medicaid Other

## 2021-11-14 IMAGING — US US MFM OB FOLLOW-UP
1 series · 14 of 28 positions shown · non-contrast
Comparison: none

[Series 1: us mfm ob follow-up · 45 acquisitions, 14 frames shown]
[im 2/45]
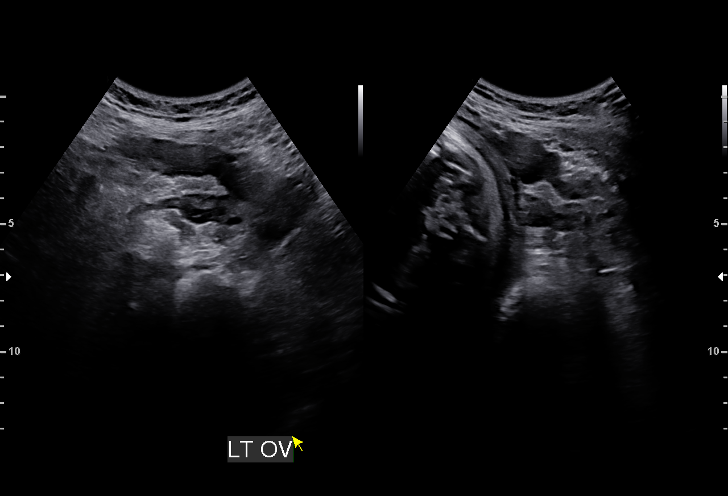
[im 5/45]
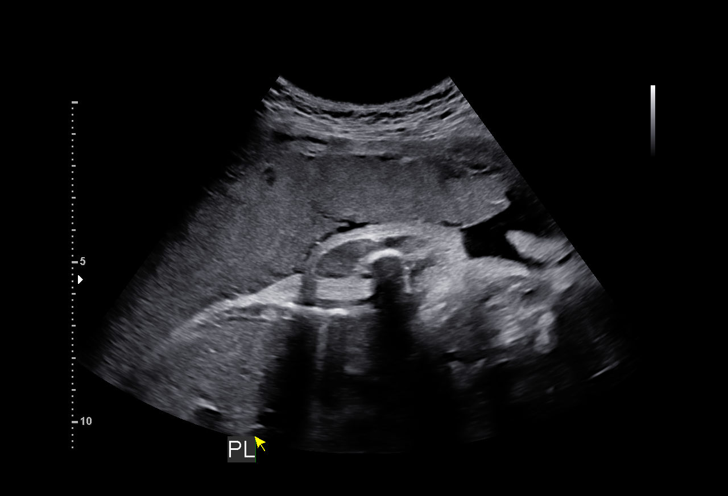
[im 9/45]
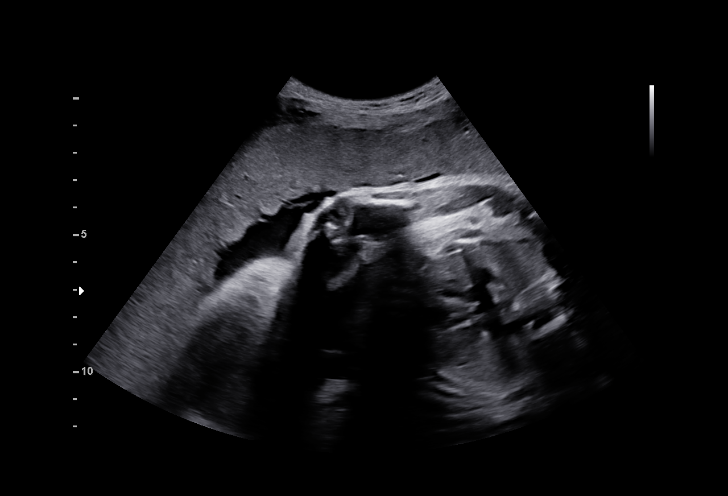
[im 12/45]
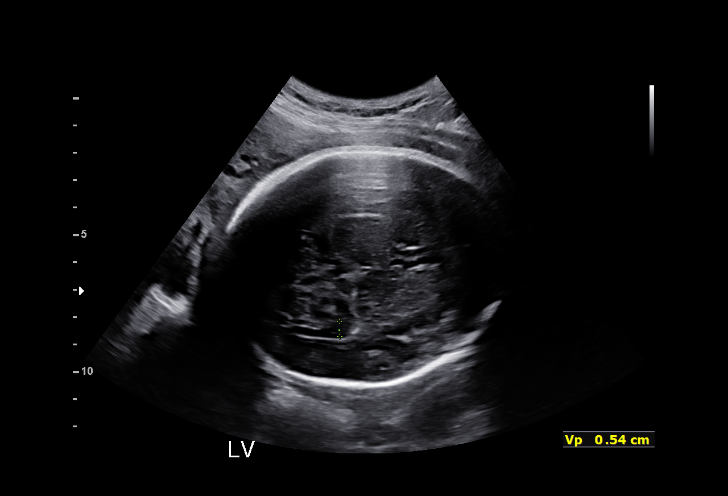
[im 15/45]
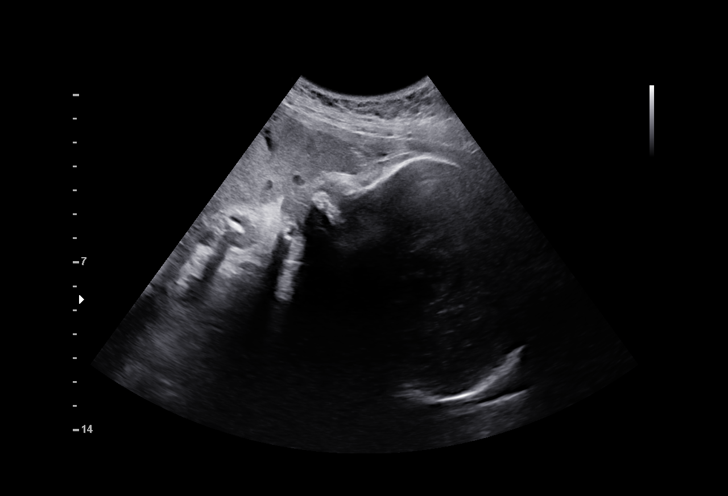
[im 18/45]
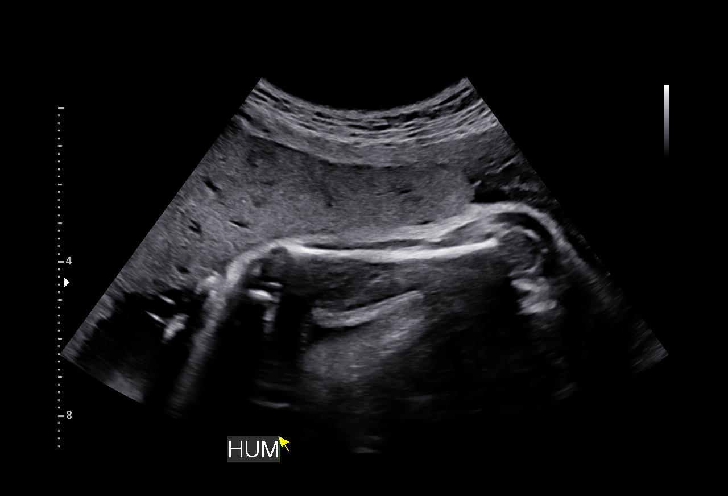
[im 22/45]
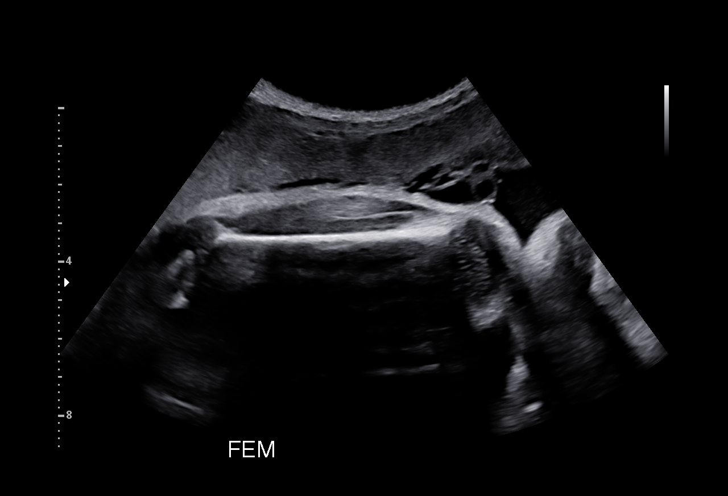
[im 25/45]
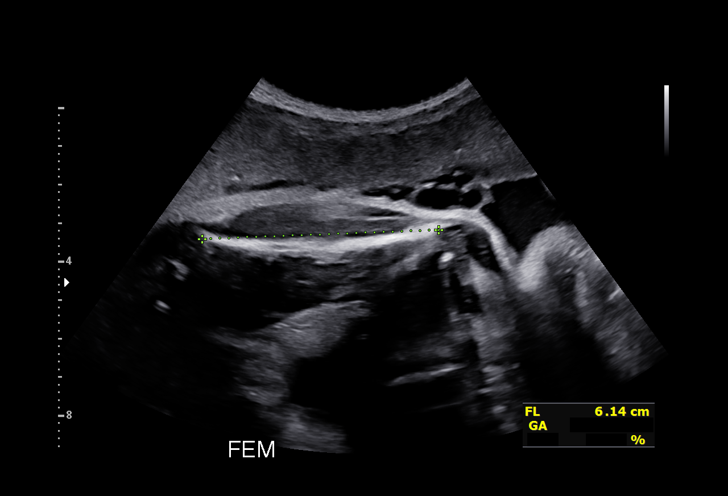
[im 28/45]
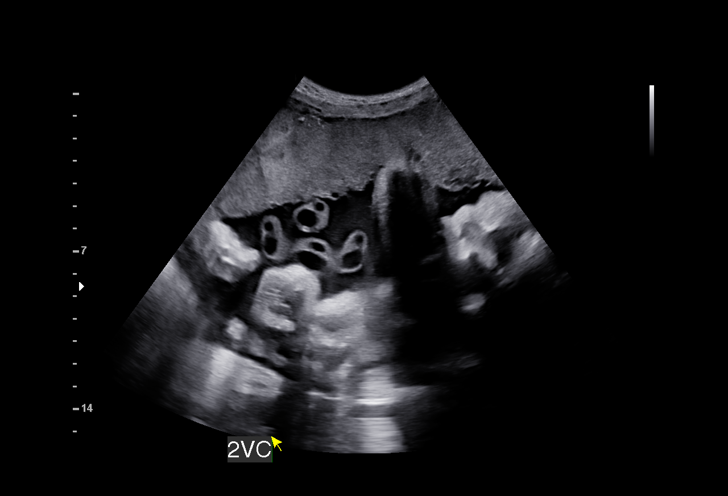
[im 31/45]
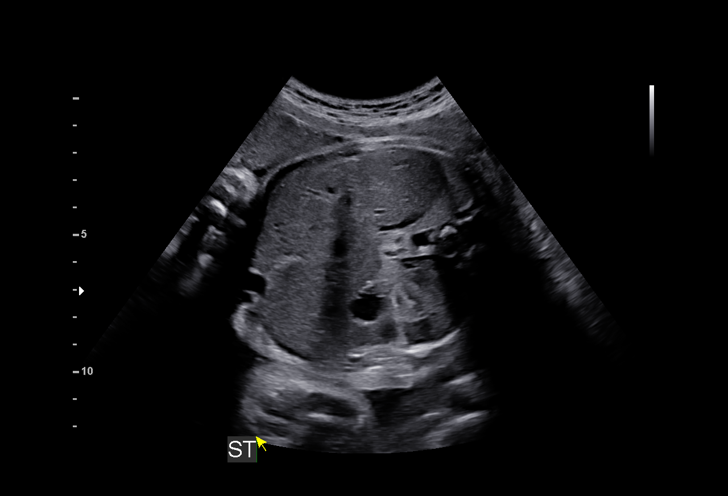
[im 35/45]
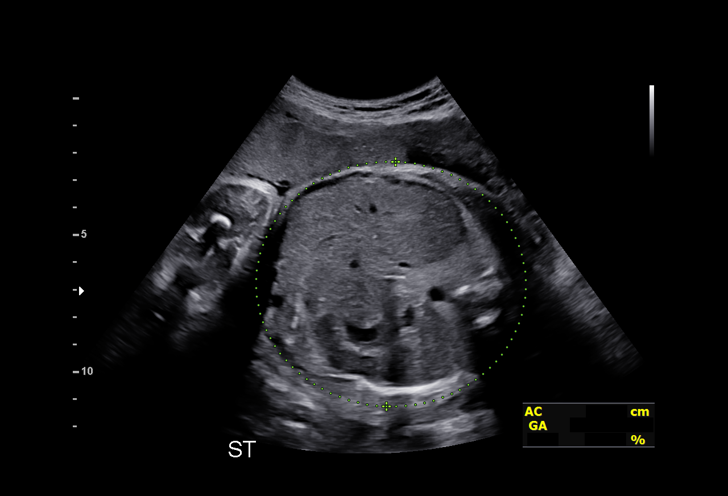
[im 38/45]
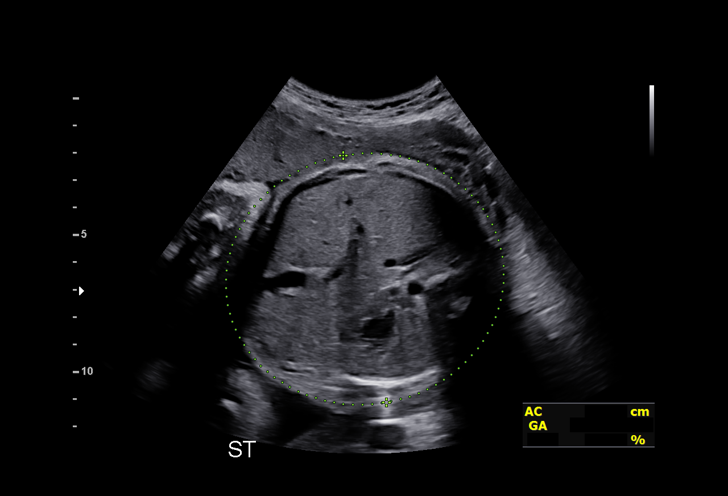
[im 41/45]
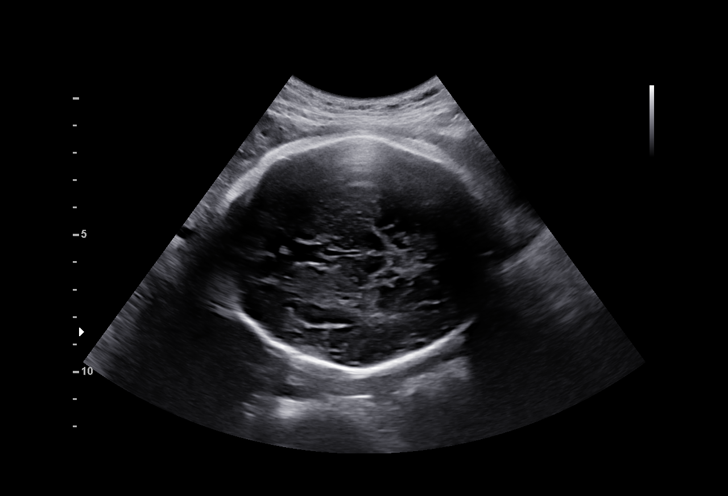
[im 45/45]
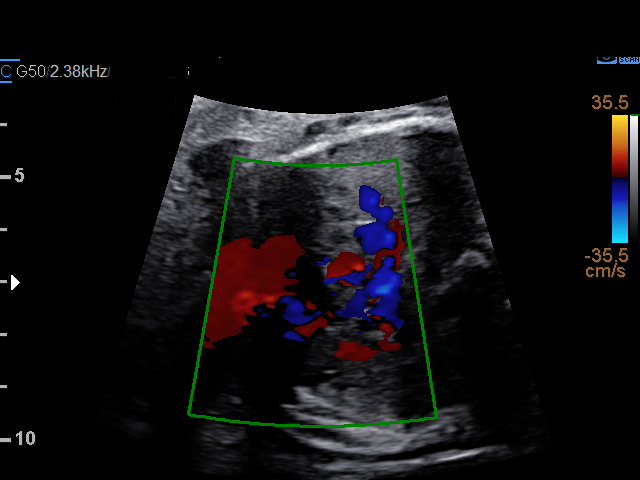

[14 of 28 positions shown; findings below may reference images not displayed]

Indications

 32 weeks gestation of pregnancy
 2 vessel umbilical cord (Rt Absent)
 Short interval between pregancies, 2nd
 trimester ([DATE])
Fetal Evaluation

 Num Of Fetuses:         1
 Preg. Location:         Intrauterine
 Fetal Heart Rate(bpm):  129
 Cardiac Activity:       Observed
 Presentation:           Cephalic
 Placenta:               Anterior
 P. Cord Insertion:      Previously Visualized

 Amniotic Fluid
 AFI FV:      Within normal limits

 AFI Sum(cm)     %Tile       Largest Pocket(cm)
 13.19           42

 RUQ(cm)       RLQ(cm)       LUQ(cm)        LLQ(cm)

Biometry

 BPD:      84.7  mm     G. Age:  34w 1d         79  %    CI:        77.89   %    70 - 86
                                                         FL/HC:      20.4   %    19.9 -
 HC:      303.7  mm     G. Age:  33w 5d         36  %    HC/AC:      1.02        0.96 -
 AC:      296.6  mm     G. Age:  33w 4d         73  %    FL/BPD:     73.2   %    71 - 87
 FL:         62  mm     G. Age:  32w 1d         20  %    FL/AC:      20.9   %    20 - 24
 HUM:      55.8  mm     G. Age:  32w 3d         49  %
 LV:        5.4  mm
 Est. FW:    3319  gm    4 lb 12 oz      54  %
OB History

 Gravidity:    5         Term:   4
 Living:       4
Gestational Age

 U/S Today:     33w 3d                                        EDD:   10/31/20
 Best:          32w 6d     Det. By:  U/S  (07/20/20)          EDD:   11/04/20
Anatomy

 Cranium:               Appears normal         Aortic Arch:            Previously seen
 Cavum:                 Appears normal         Ductal Arch:            Previously seen
 Ventricles:            Appears normal         Diaphragm:              Appears normal
 Choroid Plexus:        Previously seen        Stomach:                Appears normal, left
                                                                       sided
 Cerebellum:            Previously seen        Abdomen:                Previously seen
 Posterior Fossa:       Previously seen        Abdominal Wall:         Previously seen
 Nuchal Fold:           Not applicable (>20    Cord Vessels:           2 vessel cord,
                        wks GA)
                                                                       absent right Let Chiang
 Face:                  Orbits and profile     Kidneys:                Appear normal
                        previously seen
 Lips:                  Previously seen        Bladder:                Appears normal
 Thoracic:              Previously seen        Spine:                  Previously seen
 Heart:                 Appears normal; EIF    Upper Extremities:      Previously seen
 RVOT:                  Previously seen        Lower Extremities:      Previously seen
 LVOT:                  Previously seen

 Other:  Female gender previously seen. VC, 3VV and 3VTV previously
         visualized.
Cervix Uterus Adnexa

 Cervix
 Not visualized (advanced GA >45wks)

 Uterus
 No abnormality visualized.

 Right Ovary
 visualized

 Left Ovary
 visualized

 Cul De Sac
 No free fluid seen.

 Adnexa
 No abnormality visualized.
Impression

 Follow up growth for single umbilical artery
 Normal interval growth with measurements consistent with
 dates
 Good fetal movement and amniotic fluid volume
Recommendations

 Follow up growth as clinically indicated.

## 2021-12-09 IMAGING — US US MFM OB FOLLOW-UP
1 series · 14 of 28 positions shown · non-contrast
Comparison: none

[Series 1: us mfm ob follow-up · 14 of 40 slices shown]
[im 2/40]
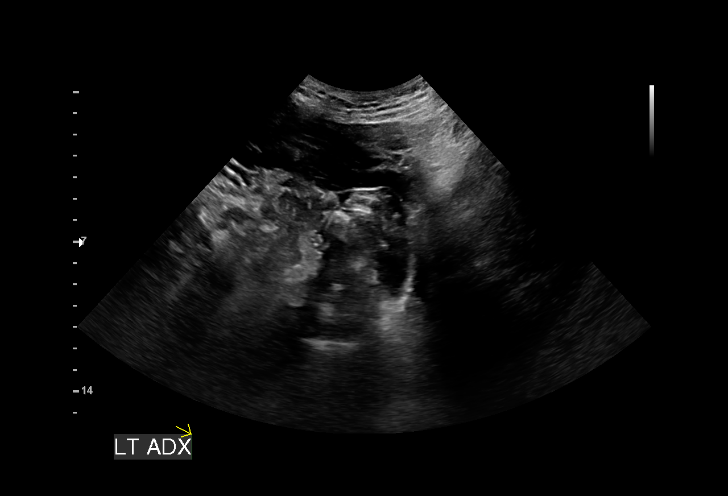
[im 5/40]
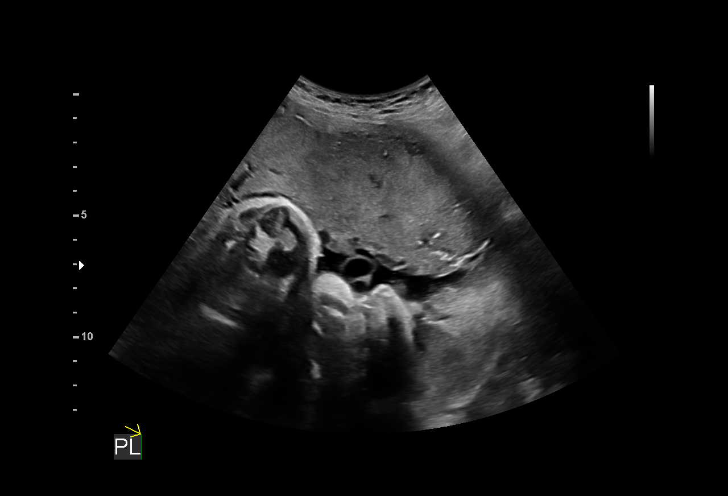
[im 8/40]
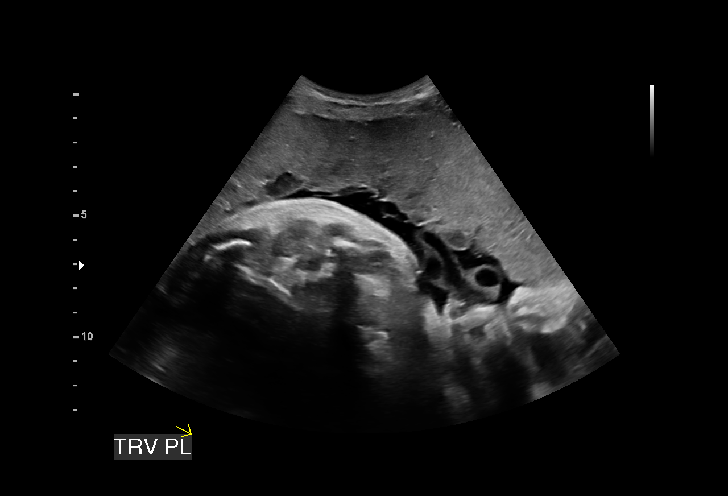
[im 11/40]
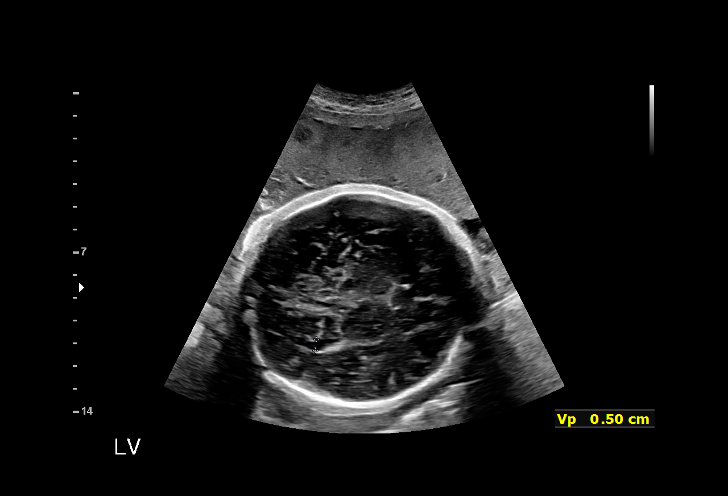
[im 14/40]
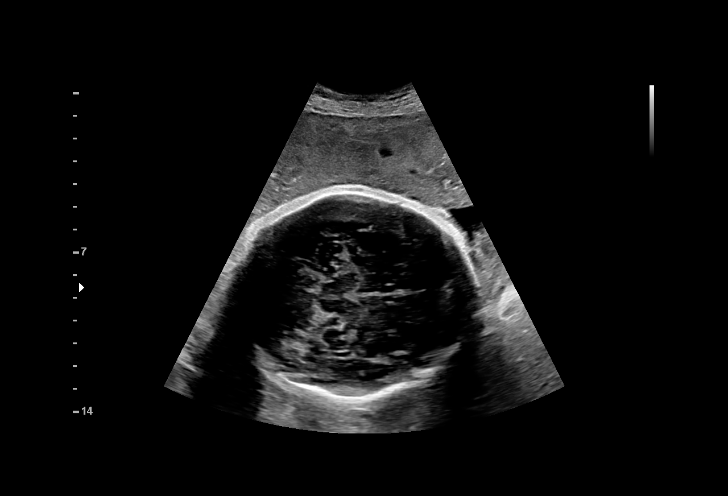
[im 16/40]
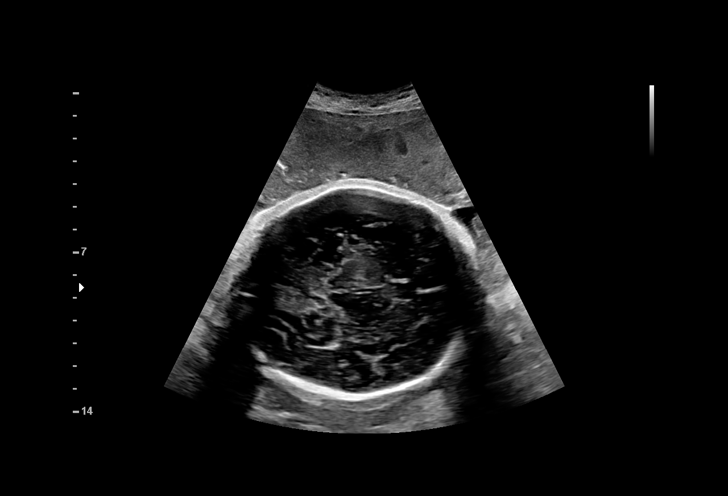
[im 19/40]
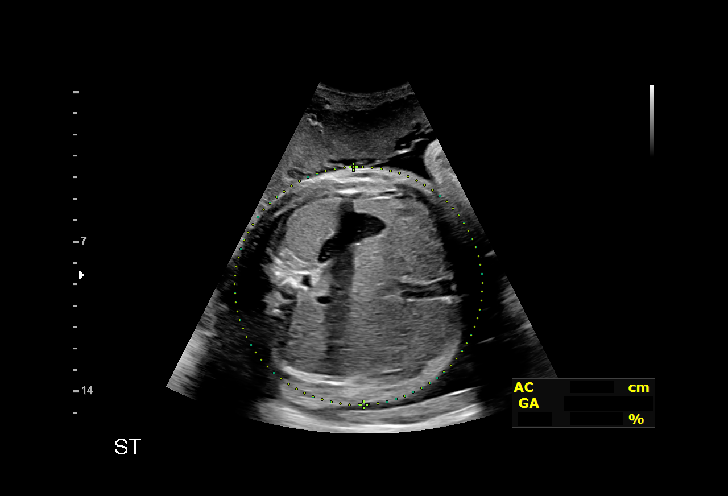
[im 22/40]
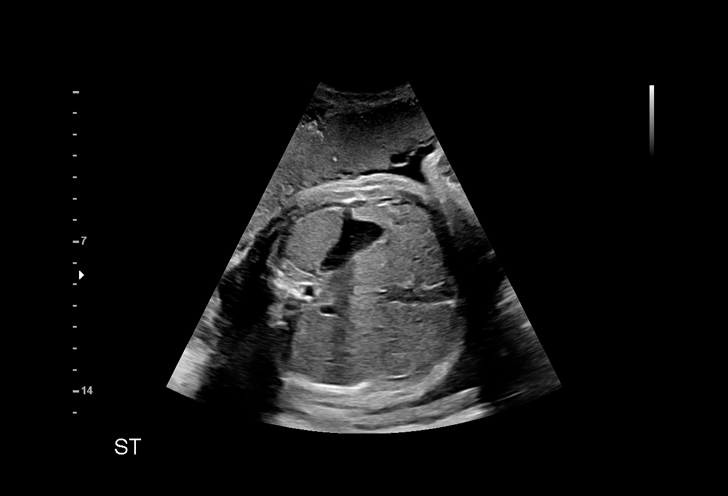
[im 25/40]
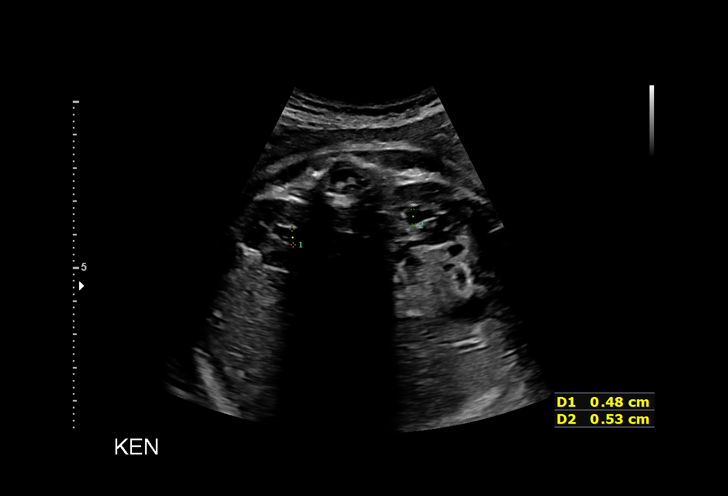
[im 28/40]
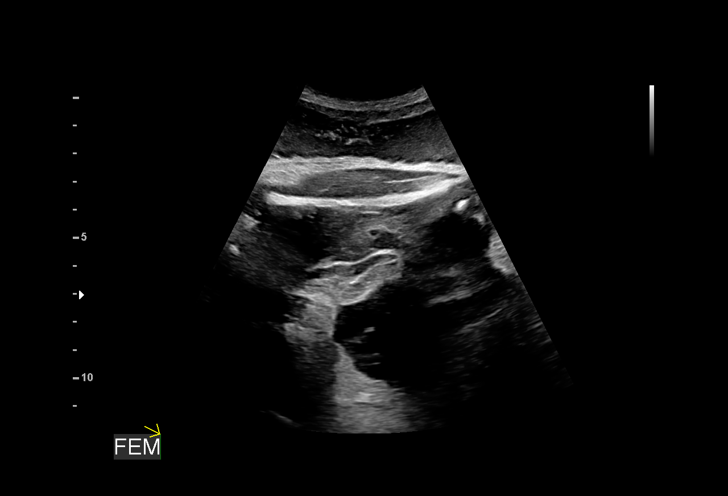
[im 31/40]
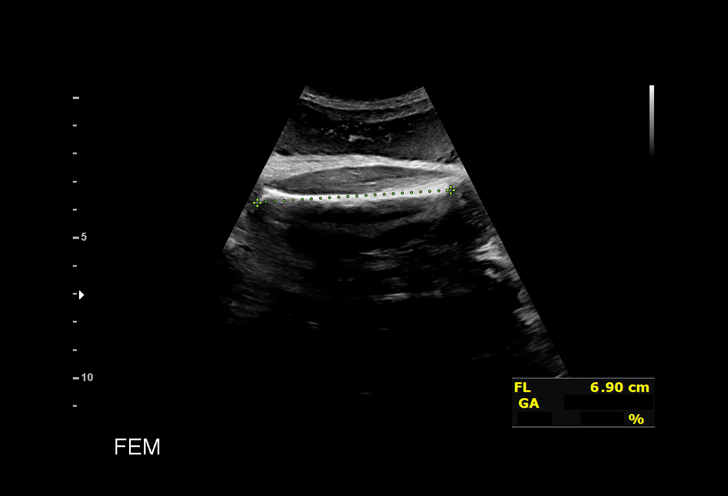
[im 34/40]
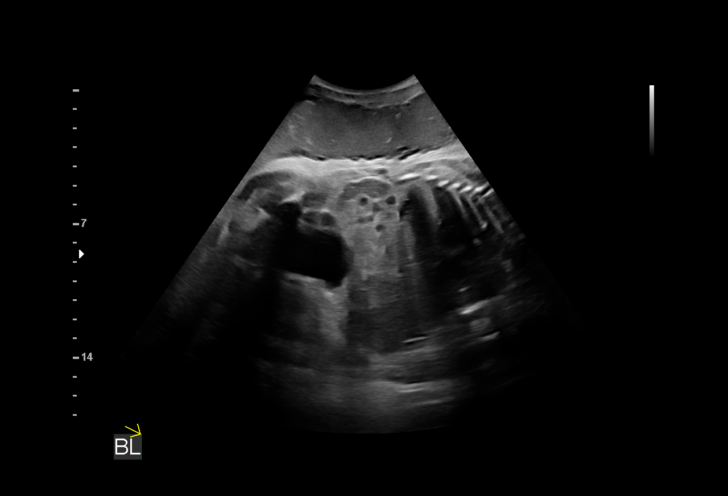
[im 37/40]
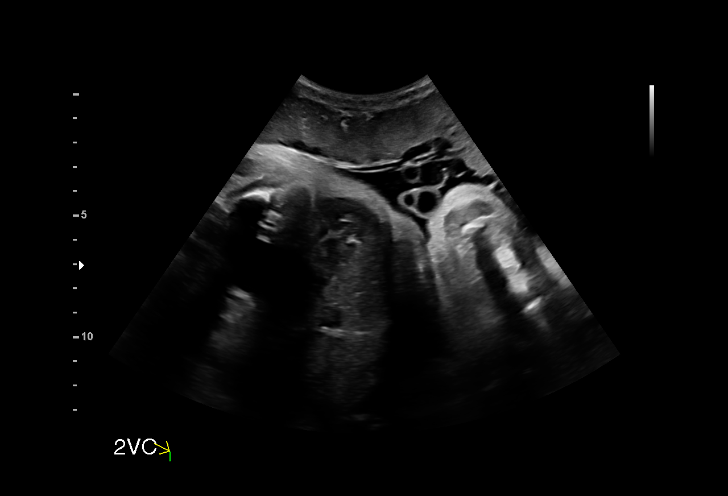
[im 40/40]
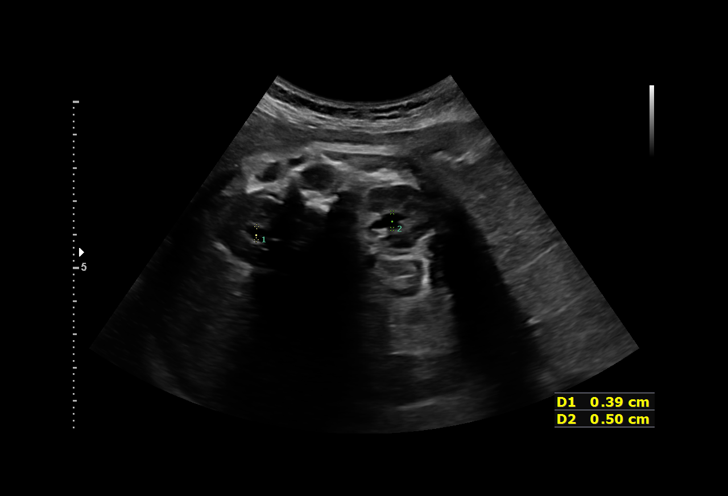

[14 of 28 positions shown; findings below may reference images not displayed]

NAGASAKI

Indications

 36 weeks gestation of pregnancy
 2 vessel umbilical cord (Rt Absent)
 Short interval between pregancies, 2nd
 trimester ([DATE])
 Encounter for other antenatal screening
 follow-up
Fetal Evaluation

 Num Of Fetuses:         1
 Preg. Location:         Intrauterine
 Fetal Heart Rate(bpm):  150
 Cardiac Activity:       Observed
 Presentation:           Cephalic
 Placenta:               Anterior

 Amniotic Fluid
 AFI FV:      Within normal limits

 AFI Sum(cm)     %Tile       Largest Pocket(cm)
 13              45

 RUQ(cm)       RLQ(cm)       LUQ(cm)        LLQ(cm)
 3.8           0
Biometry

 BPD:      90.8  mm     G. Age:  36w 6d         72  %    CI:        79.53   %    70 - 86
                                                         FL/HC:      21.1   %    20.1 -
 HC:      321.8  mm     G. Age:  36w 2d         20  %    HC/AC:      0.91        0.93 -
 AC:      353.1  mm     G. Age:  39w 2d       > 99  %    FL/BPD:     74.9   %    71 - 87
 FL:         68  mm     G. Age:  35w 0d         13  %    FL/AC:      19.3   %    20 - 24
 HUM:        60  mm     G. Age:  34w 6d         36  %
 LV:          5  mm

 Est. FW:    0775  gm      7 lb 3 oz     84  %
OB History

 Gravidity:    5         Term:   4
 Living:       4
Gestational Age

 U/S Today:     36w 6d                                        EDD:   11/01/20
 Best:          36w 3d     Det. By:  U/S  (07/20/20)          EDD:   11/04/20
Anatomy

 Cranium:               Appears normal         Aortic Arch:            Previously seen
 Cavum:                 Appears normal         Ductal Arch:            Previously seen
 Ventricles:            Appears normal         Diaphragm:              Appears normal
 Choroid Plexus:        Previously seen        Stomach:                Appears normal, left
                                                                       sided
 Cerebellum:            Previously seen        Abdomen:                Previously seen
 Posterior Fossa:       Previously seen        Abdominal Wall:         Previously seen
 Nuchal Fold:           Not applicable (>20    Cord Vessels:           2 vessel cord,
                        wks GA)
                                                                       absent right Marjoko Roll
 Face:                  Orbits and profile     Kidneys:                Appear normal
                        previously seen
 Lips:                  Previously seen        Bladder:                Appears normal
 Thoracic:              Previously seen        Spine:                  Previously seen
 Heart:                 Previously seen        Upper Extremities:      Visualized
 RVOT:                  Previously seen        Lower Extremities:      Visualized
 LVOT:                  Previously seen

 Other:  Female gender previously seen. VC, 3VV and 3VTV previously
         visualized.
Cervix Uterus Adnexa

 Cervix
 Not visualized (advanced GA >59wks)

 Uterus
 No abnormality visualized.

 Right Ovary
 Not visualized.

 Left Ovary
 Not visualized.

 Cul De Sac
 No free fluid seen.

 Adnexa
 No abnormality visualized.
Impression

 Single umbilical artery.
 Amniotic fluid is normal and good fetal activity is seen .Fetal
 growth is appropriate for gestational age .
Recommendations

 Follow-up scans as clinically indicated.
                 Brokaw, Magnolia
